# Patient Record
Sex: Female | Born: 1970
Health system: Southern US, Community
[De-identification: ages and names within clinical notes are randomized; demographics above are authoritative.]

## PROBLEM LIST (undated history)

## (undated) DIAGNOSIS — D696 Thrombocytopenia, unspecified: Secondary | ICD-10-CM

## (undated) DIAGNOSIS — B009 Herpesviral infection, unspecified: Secondary | ICD-10-CM

## (undated) DIAGNOSIS — O99119 Other diseases of the blood and blood-forming organs and certain disorders involving the immune mechanism complicating pregnancy, unspecified trimester: Secondary | ICD-10-CM

## (undated) DIAGNOSIS — C50412 Malignant neoplasm of upper-outer quadrant of left female breast: Secondary | ICD-10-CM

## (undated) DIAGNOSIS — Z923 Personal history of irradiation: Secondary | ICD-10-CM

## (undated) DIAGNOSIS — E063 Autoimmune thyroiditis: Secondary | ICD-10-CM

## (undated) DIAGNOSIS — R519 Headache, unspecified: Secondary | ICD-10-CM

## (undated) DIAGNOSIS — E039 Hypothyroidism, unspecified: Secondary | ICD-10-CM

## (undated) DIAGNOSIS — N83209 Unspecified ovarian cyst, unspecified side: Secondary | ICD-10-CM

## (undated) DIAGNOSIS — F329 Major depressive disorder, single episode, unspecified: Secondary | ICD-10-CM

## (undated) DIAGNOSIS — N200 Calculus of kidney: Secondary | ICD-10-CM

## (undated) DIAGNOSIS — R51 Headache: Secondary | ICD-10-CM

## (undated) DIAGNOSIS — G43909 Migraine, unspecified, not intractable, without status migrainosus: Secondary | ICD-10-CM

## (undated) DIAGNOSIS — F419 Anxiety disorder, unspecified: Secondary | ICD-10-CM

## (undated) DIAGNOSIS — N809 Endometriosis, unspecified: Secondary | ICD-10-CM

## (undated) DIAGNOSIS — C50919 Malignant neoplasm of unspecified site of unspecified female breast: Secondary | ICD-10-CM

## (undated) DIAGNOSIS — K219 Gastro-esophageal reflux disease without esophagitis: Secondary | ICD-10-CM

## (undated) DIAGNOSIS — F32A Depression, unspecified: Secondary | ICD-10-CM

## (undated) HISTORY — DX: Unspecified ovarian cyst, unspecified side: N83.209

## (undated) HISTORY — DX: Autoimmune thyroiditis: E06.3

## (undated) HISTORY — DX: Other diseases of the blood and blood-forming organs and certain disorders involving the immune mechanism complicating pregnancy, unspecified trimester: O99.119

## (undated) HISTORY — DX: Gastro-esophageal reflux disease without esophagitis: K21.9

## (undated) HISTORY — PX: LITHOTRIPSY: SUR834

## (undated) HISTORY — PX: BREAST LUMPECTOMY: SHX2

## (undated) HISTORY — DX: Malignant neoplasm of unspecified site of unspecified female breast: C50.919

## (undated) HISTORY — PX: DILATION AND CURETTAGE OF UTERUS: SHX78

## (undated) HISTORY — DX: Headache, unspecified: R51.9

## (undated) HISTORY — PX: WRIST SURGERY: SHX841

## (undated) HISTORY — DX: Headache: R51

## (undated) HISTORY — DX: Endometriosis, unspecified: N80.9

## (undated) HISTORY — DX: Migraine, unspecified, not intractable, without status migrainosus: G43.909

## (undated) HISTORY — PX: UPPER GASTROINTESTINAL ENDOSCOPY: SHX188

## (undated) HISTORY — PX: BREAST ENHANCEMENT SURGERY: SHX7

## (undated) HISTORY — DX: Thrombocytopenia, unspecified: D69.6

## (undated) HISTORY — DX: Hypothyroidism, unspecified: E03.9

## (undated) HISTORY — DX: Herpesviral infection, unspecified: B00.9

## (undated) HISTORY — PX: LAPAROSCOPY: SHX197

---

## 2009-01-16 LAB — US OB FOLLOW UP

## 2009-05-26 LAB — US OB COMP LESS 14 WKS

## 2013-11-26 LAB — HM PAP SMEAR: HM PAP: POSITIVE

## 2013-12-24 ENCOUNTER — Encounter: Payer: Self-pay | Admitting: Obstetrics & Gynecology

## 2013-12-26 ENCOUNTER — Other Ambulatory Visit: Payer: Self-pay | Admitting: Obstetrics & Gynecology

## 2013-12-26 ENCOUNTER — Ambulatory Visit (INDEPENDENT_AMBULATORY_CARE_PROVIDER_SITE_OTHER): Payer: 59 | Admitting: Obstetrics & Gynecology

## 2013-12-26 ENCOUNTER — Encounter: Payer: Self-pay | Admitting: *Deleted

## 2013-12-26 ENCOUNTER — Encounter: Payer: Self-pay | Admitting: Obstetrics & Gynecology

## 2013-12-26 VITALS — Ht 63.0 in | Wt 118.0 lb

## 2013-12-26 DIAGNOSIS — R87619 Unspecified abnormal cytological findings in specimens from cervix uteri: Secondary | ICD-10-CM

## 2013-12-26 MED ORDER — SILVER SULFADIAZINE 1 % EX CREA: TOPICAL_CREAM | CUTANEOUS | Status: DC

## 2013-12-30 ENCOUNTER — Telehealth: Payer: Self-pay | Admitting: Obstetrics & Gynecology

## 2013-12-30 NOTE — Telephone Encounter (Signed)
Pt informed Silvadene cream e-scribed on 12/26/2013 to [facility] with 11 refills.

## 2014-03-11 NOTE — Progress Notes (Signed)
Patient ID: [name], female   DOB: 06-16-1970, 44 y.o.   MRN: [id number] Addended note:  Colposcopy Procedure Note  Indications: Pap smear 1 months ago showed: ASCUS with POSITIVE high risk HPV. The prior pap showed no abnormalities.  Prior cervical/vaginal disease: normal exam without visible pathology. Prior cervical treatment: no treatment.  Procedure Details  The risks and benefits of the procedure and Written informed consent obtained.  Speculum placed in vagina and excellent visualization of cervix achieved, cervix swabbed x 3 with acetic acid solution.  Findings: Cervix: koilocytic atypia in small endocervical polyp noted, no mosaicism, no punctation and no abnormal vasculature; acetic acid used for cervical visualization. Squamous mucosa was normal otherwise Vaginal inspection: normal without visible lesions. Vulvar colposcopy: vulvar colposcopy not performed.  Specimens: Endocervical biopsy  Complications: none.  Plan: Per pt request will follow up her pathology with a Phone call to give results My impression is this is HPV atypia and nothing more  Path report: Present in Path tab but mistakenly not placed in this non visit notification note:  Benign changes no dysplasia or abnormalities Repeat Pap in 1 year for re evaluation

## 2014-03-21 ENCOUNTER — Other Ambulatory Visit (HOSPITAL_COMMUNITY): Payer: Self-pay | Admitting: Oncology

## 2014-03-21 DIAGNOSIS — R110 Nausea: Secondary | ICD-10-CM

## 2014-03-21 MED ORDER — ONDANSETRON HCL 4 MG PO TABS: 4.0000 mg | ORAL_TABLET | Freq: Four times a day (QID) | ORAL | Status: DC | PRN

## 2014-04-01 ENCOUNTER — Other Ambulatory Visit: Payer: Self-pay | Admitting: Urology

## 2014-04-01 ENCOUNTER — Other Ambulatory Visit (HOSPITAL_COMMUNITY): Payer: Self-pay | Admitting: Oncology

## 2014-04-01 ENCOUNTER — Ambulatory Visit (INDEPENDENT_AMBULATORY_CARE_PROVIDER_SITE_OTHER): Payer: 59 | Admitting: Urology

## 2014-04-01 ENCOUNTER — Ambulatory Visit (HOSPITAL_COMMUNITY)
Admission: RE | Admit: 2014-04-01 | Discharge: 2014-04-01 | Disposition: A | Discharge status: Home / Self Care | Payer: 59 | Source: Ambulatory Visit | Attending: Urology | Admitting: Urology

## 2014-04-01 DIAGNOSIS — N393 Stress incontinence (female) (male): Secondary | ICD-10-CM

## 2014-04-01 DIAGNOSIS — R32 Unspecified urinary incontinence: Secondary | ICD-10-CM | POA: Diagnosis not present

## 2014-04-01 DIAGNOSIS — R934 Abnormal findings on diagnostic imaging of urinary organs: Secondary | ICD-10-CM | POA: Insufficient documentation

## 2014-04-01 DIAGNOSIS — N200 Calculus of kidney: Secondary | ICD-10-CM | POA: Diagnosis not present

## 2014-04-01 DIAGNOSIS — Z0000 Encounter for general adult medical examination without abnormal findings: Secondary | ICD-10-CM

## 2014-04-01 DIAGNOSIS — Z87442 Personal history of urinary calculi: Secondary | ICD-10-CM | POA: Diagnosis not present

## 2014-04-02 ENCOUNTER — Other Ambulatory Visit (HOSPITAL_COMMUNITY): Payer: Self-pay | Admitting: Oncology

## 2014-04-02 DIAGNOSIS — G43511 Persistent migraine aura without cerebral infarction, intractable, with status migrainosus: Secondary | ICD-10-CM

## 2014-04-02 MED ORDER — FROVATRIPTAN SUCCINATE 2.5 MG PO TABS: 2.5000 mg | ORAL_TABLET | ORAL | Status: DC | PRN

## 2014-04-07 ENCOUNTER — Other Ambulatory Visit (HOSPITAL_COMMUNITY): Payer: Self-pay | Admitting: Oncology

## 2014-04-07 DIAGNOSIS — R232 Flushing: Secondary | ICD-10-CM

## 2014-04-08 ENCOUNTER — Other Ambulatory Visit (HOSPITAL_COMMUNITY): Payer: Self-pay | Admitting: Oncology

## 2014-04-08 DIAGNOSIS — D509 Iron deficiency anemia, unspecified: Secondary | ICD-10-CM

## 2014-04-08 MED ORDER — IRON POLYSACCH CMPLX-B12-FA 150-0.025-1 MG PO CAPS
1.0000 | ORAL_CAPSULE | Freq: Every day | ORAL | Status: DC
Start: 2014-04-08 — End: 2015-07-21

## 2014-04-09 ENCOUNTER — Other Ambulatory Visit (HOSPITAL_COMMUNITY): Payer: Self-pay | Admitting: Oncology

## 2014-04-09 ENCOUNTER — Other Ambulatory Visit: Payer: Self-pay | Admitting: Obstetrics & Gynecology

## 2014-04-09 DIAGNOSIS — Z0000 Encounter for general adult medical examination without abnormal findings: Secondary | ICD-10-CM

## 2014-04-09 DIAGNOSIS — R232 Flushing: Secondary | ICD-10-CM

## 2014-04-09 LAB — CBC WITH DIFFERENTIAL/PLATELET
BASOS PCT: 0 % (ref 0–1)
Basophils Absolute: 0 10*3/uL (ref 0.0–0.1)
Eosinophils Absolute: 0.1 10*3/uL (ref 0.0–0.7)
Eosinophils Relative: 3 % (ref 0–5)
HEMATOCRIT: 42.4 % (ref 36.0–46.0)
HEMOGLOBIN: 13.7 g/dL (ref 12.0–15.0)
Lymphocytes Relative: 32 % (ref 12–46)
Lymphs Abs: 1.2 10*3/uL (ref 0.7–4.0)
MCH: 31.1 pg (ref 26.0–34.0)
MCHC: 32.3 g/dL (ref 30.0–36.0)
MCV: 96.4 fL (ref 78.0–100.0)
MONOS PCT: 12 % (ref 3–12)
MPV: 10.9 fL (ref 8.6–12.4)
Monocytes Absolute: 0.4 10*3/uL (ref 0.1–1.0)
NEUTROS ABS: 1.9 10*3/uL (ref 1.7–7.7)
Neutrophils Relative %: 53 % (ref 43–77)
Platelets: 183 10*3/uL (ref 150–400)
RBC: 4.4 MIL/uL (ref 3.87–5.11)
RDW: 12.6 % (ref 11.5–15.5)
WBC: 3.6 10*3/uL — ABNORMAL LOW (ref 4.0–10.5)

## 2014-04-09 NOTE — Progress Notes (Signed)
Chart accessed to release lab orders - requisitions printed for [name].

## 2014-04-10 LAB — COMPREHENSIVE METABOLIC PANEL
ALK PHOS: 42 U/L (ref 39–117)
ALT: 11 U/L (ref 0–35)
AST: 18 U/L (ref 0–37)
Albumin: 4.4 g/dL (ref 3.5–5.2)
BUN: 16 mg/dL (ref 6–23)
CALCIUM: 8.9 mg/dL (ref 8.4–10.5)
CO2: 27 meq/L (ref 19–32)
Chloride: 104 mEq/L (ref 96–112)
Creat: 0.7 mg/dL (ref 0.50–1.10)
GLUCOSE: 84 mg/dL (ref 70–99)
POTASSIUM: 4.1 meq/L (ref 3.5–5.3)
SODIUM: 138 meq/L (ref 135–145)
Total Bilirubin: 0.5 mg/dL (ref 0.2–1.2)
Total Protein: 7.3 g/dL (ref 6.0–8.3)

## 2014-04-10 LAB — TSH+FREE T4
FREE T4: 1.47 ng/dL (ref 0.80–1.80)
TSH: 0.387 u[IU]/mL (ref 0.350–4.500)

## 2014-04-10 LAB — LIPID PANEL
CHOL/HDL RATIO: 2.9 ratio
CHOLESTEROL: 200 mg/dL (ref 0–200)
HDL: 70 mg/dL (ref >=46)
LDL Cholesterol: 120 mg/dL — ABNORMAL HIGH (ref 0–99)
TRIGLYCERIDES: 50 mg/dL (ref <150)
VLDL: 10 mg/dL (ref 0–40)

## 2014-04-10 LAB — FOLLICLE STIMULATING HORMONE: FSH: 12.4 m[IU]/mL

## 2014-04-10 LAB — FERRITIN: Ferritin: 31 ng/mL (ref 10–291)

## 2014-04-10 LAB — VITAMIN D 25 HYDROXY (VIT D DEFICIENCY, FRACTURES): Vit D, 25-Hydroxy: 20 ng/mL — ABNORMAL LOW (ref 30–100)

## 2014-04-10 LAB — ESTRADIOL: Estradiol: 53.6 pg/mL

## 2014-06-02 ENCOUNTER — Other Ambulatory Visit (HOSPITAL_COMMUNITY): Payer: Self-pay | Admitting: Oncology

## 2014-06-02 DIAGNOSIS — R0781 Pleurodynia: Secondary | ICD-10-CM

## 2014-06-04 ENCOUNTER — Other Ambulatory Visit (HOSPITAL_COMMUNITY): Payer: Self-pay | Admitting: Oncology

## 2014-06-04 DIAGNOSIS — G43909 Migraine, unspecified, not intractable, without status migrainosus: Secondary | ICD-10-CM | POA: Insufficient documentation

## 2014-06-04 DIAGNOSIS — G43101 Migraine with aura, not intractable, with status migrainosus: Secondary | ICD-10-CM

## 2014-06-04 MED ORDER — SUMATRIPTAN SUCCINATE 100 MG PO TABS: 100.0000 mg | ORAL_TABLET | ORAL | Status: DC | PRN

## 2014-07-24 ENCOUNTER — Telehealth (INDEPENDENT_AMBULATORY_CARE_PROVIDER_SITE_OTHER): Payer: Self-pay | Admitting: *Deleted

## 2014-07-24 NOTE — Telephone Encounter (Signed)
-----   Message from Shirley Houston, MD sent at 07/23/2014  3:47 PM EDT ----- Gershon Mussel, as discussed with you will schedule Dr. Whitney Muse for Colonoscopy. I believe we are dealing with hemorrhoidal bleeding since she does not have chronic symptoms.  Rehman ----- Message -----    From: Baird Cancer, PA-C    Sent: 07/22/2014   3:27 PM      To: Shirley Houston, MD  Good afternoon Dr. Laural Golden,  Hate to bother you, but since Dr. Whitney Muse and I share an office, and share a lot about ourselves, I would like to refer her to you.  Today she passed a significant amount of blood in her BM.  What options does she have and do you need a referral from me to get her in to see you?  Thank you!  Kirby Crigler, PA-C

## 2014-07-24 NOTE — Telephone Encounter (Signed)
Please go ahead per NUR and schedule her for a TCS.

## 2014-07-25 ENCOUNTER — Other Ambulatory Visit (INDEPENDENT_AMBULATORY_CARE_PROVIDER_SITE_OTHER): Payer: Self-pay | Admitting: *Deleted

## 2014-07-25 DIAGNOSIS — K625 Hemorrhage of anus and rectum: Secondary | ICD-10-CM

## 2014-07-25 NOTE — Telephone Encounter (Signed)
I spoke to Dr Whitney Muse (staff message) and she is sch'd for TCS on 08/11/14

## 2014-07-25 NOTE — Telephone Encounter (Signed)
Thanks

## 2014-07-31 ENCOUNTER — Telehealth (INDEPENDENT_AMBULATORY_CARE_PROVIDER_SITE_OTHER): Payer: Self-pay | Admitting: *Deleted

## 2014-07-31 MED ORDER — PEG-KCL-NACL-NASULF-NA ASC-C 100 G PO SOLR: 1.0000 | Freq: Once | ORAL | Status: DC

## 2014-07-31 NOTE — Telephone Encounter (Signed)
Patient needs movi prep 

## 2014-08-01 ENCOUNTER — Other Ambulatory Visit (HOSPITAL_COMMUNITY): Payer: Self-pay | Admitting: Oncology

## 2014-08-01 MED ORDER — DROSPIRENONE-ETHINYL ESTRADIOL 3-0.02 MG PO TABS: ORAL_TABLET | ORAL | Status: DC

## 2014-08-01 MED ORDER — LEVONORGEST-ETH ESTRAD 91-DAY 0.1-0.02 & 0.01 MG PO TABS: ORAL_TABLET | ORAL | Status: DC

## 2014-08-11 ENCOUNTER — Encounter (HOSPITAL_COMMUNITY): Payer: Self-pay | Admitting: *Deleted

## 2014-08-11 ENCOUNTER — Encounter (HOSPITAL_COMMUNITY)
Admission: RE | Disposition: A | Discharge status: Home / Self Care | Payer: Self-pay | Source: Ambulatory Visit | Attending: Internal Medicine

## 2014-08-11 ENCOUNTER — Ambulatory Visit (HOSPITAL_COMMUNITY)
Admission: RE | Admit: 2014-08-11 | Discharge: 2014-08-11 | Disposition: A | Discharge status: Home / Self Care | Payer: 59 | Source: Ambulatory Visit | Attending: Internal Medicine | Admitting: Internal Medicine

## 2014-08-11 DIAGNOSIS — K921 Melena: Secondary | ICD-10-CM | POA: Diagnosis present

## 2014-08-11 DIAGNOSIS — K5900 Constipation, unspecified: Secondary | ICD-10-CM | POA: Diagnosis not present

## 2014-08-11 DIAGNOSIS — K625 Hemorrhage of anus and rectum: Secondary | ICD-10-CM | POA: Diagnosis not present

## 2014-08-11 DIAGNOSIS — K644 Residual hemorrhoidal skin tags: Secondary | ICD-10-CM | POA: Insufficient documentation

## 2014-08-11 DIAGNOSIS — K648 Other hemorrhoids: Secondary | ICD-10-CM | POA: Insufficient documentation

## 2014-08-11 DIAGNOSIS — Z79899 Other long term (current) drug therapy: Secondary | ICD-10-CM | POA: Diagnosis not present

## 2014-08-11 DIAGNOSIS — E039 Hypothyroidism, unspecified: Secondary | ICD-10-CM | POA: Diagnosis not present

## 2014-08-11 DIAGNOSIS — Z800 Family history of malignant neoplasm of digestive organs: Secondary | ICD-10-CM | POA: Diagnosis not present

## 2014-08-11 HISTORY — DX: Calculus of kidney: N20.0

## 2014-08-11 HISTORY — PX: COLONOSCOPY: SHX5424

## 2014-08-11 SURGERY — COLONOSCOPY
Anesthesia: Moderate Sedation

## 2014-08-11 MED ORDER — MIDAZOLAM HCL 5 MG/5ML IJ SOLN
INTRAMUSCULAR | Status: DC | PRN
  Administered 2014-08-11 (×3): 2 mg via INTRAVENOUS
  Administered 2014-08-11: 3 mg via INTRAVENOUS
  Administered 2014-08-11 (×4): 2 mg via INTRAVENOUS

## 2014-08-11 MED ORDER — SODIUM CHLORIDE 0.9 % IV SOLN
INTRAVENOUS | Status: DC
  Administered 2014-08-11: 07:00:00 via INTRAVENOUS

## 2014-08-11 MED ORDER — STERILE WATER FOR IRRIGATION IR SOLN
Status: DC | PRN
  Administered 2014-08-11: 08:00:00

## 2014-08-11 MED ORDER — MEPERIDINE HCL 50 MG/ML IJ SOLN
INTRAMUSCULAR | Status: AC
  Filled 2014-08-11: qty 1

## 2014-08-11 MED ORDER — MEPERIDINE HCL 50 MG/ML IJ SOLN
INTRAMUSCULAR | Status: DC | PRN
  Administered 2014-08-11 (×2): 25 mg via INTRAVENOUS

## 2014-08-11 MED ORDER — MIDAZOLAM HCL 5 MG/5ML IJ SOLN
INTRAMUSCULAR | Status: AC
  Filled 2014-08-11: qty 10

## 2014-08-11 MED ORDER — MIDAZOLAM HCL 5 MG/5ML IJ SOLN
INTRAMUSCULAR | Status: AC
  Filled 2014-08-11: qty 5

## 2014-08-11 MED ORDER — POLYETHYLENE GLYCOL 3350 17 GM/SCOOP PO POWD: 8.5000 g | Freq: Every day | ORAL | Status: DC

## 2014-08-11 NOTE — Discharge Instructions (Signed)
Resume usual medications and high fiber diet. Polyethylene glycol 8.5 g by mouth daily or every other day. No driving for 24 hours.  Colonoscopy, Care After Refer to this sheet in the next few weeks. These instructions provide you with information on caring for yourself after your procedure. Your health care provider may also give you more specific instructions. Your treatment has been planned according to current medical practices, but problems sometimes occur. Call your health care provider if you have any problems or questions after your procedure. WHAT TO EXPECT AFTER THE PROCEDURE  After your procedure, it is typical to have the following:  A small amount of blood in your stool.  Moderate amounts of gas and mild abdominal cramping or bloating. HOME CARE INSTRUCTIONS  Do not drive, operate machinery, or sign important documents for 24 hours.  You may shower and resume your regular physical activities, but move at a slower pace for the first 24 hours.  Take frequent rest periods for the first 24 hours.  Walk around or put a warm pack on your abdomen to help reduce abdominal cramping and bloating.  Drink enough fluids to keep your urine clear or pale yellow.  You may resume your normal diet as instructed by your health care provider. Avoid heavy or fried foods that are hard to digest.  Avoid drinking alcohol for 24 hours or as instructed by your health care provider.  Only take over-the-counter or prescription medicines as directed by your health care provider.  If a tissue sample (biopsy) was taken during your procedure:  Do not take aspirin or blood thinners for 7 days, or as instructed by your health care provider.  Do not drink alcohol for 7 days, or as instructed by your health care provider.  Eat soft foods for the first 24 hours. SEEK MEDICAL CARE IF: You have persistent spotting of blood in your stool 2-3 days after the procedure. SEEK IMMEDIATE MEDICAL CARE  IF:  You have more than a small spotting of blood in your stool.  You pass large blood clots in your stool.  Your abdomen is swollen (distended).  You have nausea or vomiting.  You have a fever.  You have increasing abdominal pain that is not relieved with medicine.  Hemorrhoids Hemorrhoids are swollen veins around the rectum or anus. There are two types of hemorrhoids:   Internal hemorrhoids. These occur in the veins just inside the rectum. They may poke through to the outside and become irritated and painful.  External hemorrhoids. These occur in the veins outside the anus and can be felt as a painful swelling or hard lump near the anus. CAUSES  Pregnancy.   Obesity.   Constipation or diarrhea.   Straining to have a bowel movement.   Sitting for long periods on the toilet.  Heavy lifting or other activity that caused you to strain.  Anal intercourse. SYMPTOMS   Pain.   Anal itching or irritation.   Rectal bleeding.   Fecal leakage.   Anal swelling.   One or more lumps around the anus.  DIAGNOSIS  Your caregiver may be able to diagnose hemorrhoids by visual examination. Other examinations or tests that may be performed include:   Examination of the rectal area with a gloved hand (digital rectal exam).   Examination of anal canal using a small tube (scope).   A blood test if you have lost a significant amount of blood.  A test to look inside the colon (sigmoidoscopy or colonoscopy). TREATMENT  Most hemorrhoids can be treated at home. However, if symptoms do not seem to be getting better or if you have a lot of rectal bleeding, your caregiver may perform a procedure to help make the hemorrhoids get smaller or remove them completely. Possible treatments include:   Placing a rubber band at the base of the hemorrhoid to cut off the circulation (rubber band ligation).   Injecting a chemical to shrink the hemorrhoid (sclerotherapy).   Using a  tool to burn the hemorrhoid (infrared light therapy).   Surgically removing the hemorrhoid (hemorrhoidectomy).   Stapling the hemorrhoid to block blood flow to the tissue (hemorrhoid stapling).  HOME CARE INSTRUCTIONS   Eat foods with fiber, such as whole grains, beans, nuts, fruits, and vegetables. Ask your doctor about taking products with added fiber in them (fibersupplements).  Increase fluid intake. Drink enough water and fluids to keep your urine clear or pale yellow.   Exercise regularly.   Go to the bathroom when you have the urge to have a bowel movement. Do not wait.   Avoid straining to have bowel movements.   Keep the anal area dry and clean. Use wet toilet paper or moist towelettes after a bowel movement.   Medicated creams and suppositories may be used or applied as directed.   Only take over-the-counter or prescription medicines as directed by your caregiver.   Take warm sitz baths for 15-20 minutes, 3-4 times a day to ease pain and discomfort.   Place ice packs on the hemorrhoids if they are tender and swollen. Using ice packs between sitz baths may be helpful.   Put ice in a plastic bag.   Place a towel between your skin and the bag.   Leave the ice on for 15-20 minutes, 3-4 times a day.   Do not use a donut-shaped pillow or sit on the toilet for long periods. This increases blood pooling and pain.  SEEK MEDICAL CARE IF:  You have increasing pain and swelling that is not controlled by treatment or medicine.  You have uncontrolled bleeding.  You have difficulty or you are unable to have a bowel movement.  You have pain or inflammation outside the area of the hemorrhoids. MAKE SURE YOU:  Understand these instructions.  Will watch your condition.  Will get help right away if you are not doing well or get worse. High-Fiber Diet Fiber is found in fruits, vegetables, and grains. A high-fiber diet encourages the addition of more whole  grains, legumes, fruits, and vegetables in your diet. The recommended amount of fiber for adult males is 38 g per day. For adult females, it is 25 g per day. Pregnant and lactating women should get 28 g of fiber per day. If you have a digestive or bowel problem, ask your caregiver for advice before adding high-fiber foods to your diet. Eat a variety of high-fiber foods instead of only a select few type of foods.  PURPOSE  To increase stool bulk.  To make bowel movements more regular to prevent constipation.  To lower cholesterol.  To prevent overeating. WHEN IS THIS DIET USED?  It may be used if you have constipation and hemorrhoids.  It may be used if you have uncomplicated diverticulosis (intestine condition) and irritable bowel syndrome.  It may be used if you need help with weight management.  It may be used if you want to add it to your diet as a protective measure against atherosclerosis, diabetes, and cancer. SOURCES OF FIBER  Whole-grain breads and cereals.  Fruits, such as apples, oranges, bananas, berries, prunes, and pears.  Vegetables, such as green peas, carrots, sweet potatoes, beets, broccoli, cabbage, spinach, and artichokes.  Legumes, such split peas, soy, lentils.  Almonds. FIBER CONTENT IN FOODS Starches and Grains / Dietary Fiber (g)  Cheerios, 1 cup / 3 g  Corn Flakes cereal, 1 cup / 0.7 g  Rice crispy treat cereal, 1 cup / 0.3 g  Instant oatmeal (cooked),  cup / 2 g  Frosted wheat cereal, 1 cup / 5.1 g  Brown, long-grain rice (cooked), 1 cup / 3.5 g  White, long-grain rice (cooked), 1 cup / 0.6 g  Enriched macaroni (cooked), 1 cup / 2.5 g Legumes / Dietary Fiber (g)  Baked beans (canned, plain, or vegetarian),  cup / 5.2 g  Kidney beans (canned),  cup / 6.8 g  Pinto beans (cooked),  cup / 5.5 g Breads and Crackers / Dietary Fiber (g)  Plain or honey graham crackers, 2 squares / 0.7 g  Saltine crackers, 3 squares / 0.3 g  Plain,  salted pretzels, 10 pieces / 1.8 g  Whole-wheat bread, 1 slice / 1.9 g  White bread, 1 slice / 0.7 g  Raisin bread, 1 slice / 1.2 g  Plain bagel, 3 oz / 2 g  Flour tortilla, 1 oz / 0.9 g  Corn tortilla, 1 small / 1.5 g  Hamburger or hotdog bun, 1 small / 0.9 g Fruits / Dietary Fiber (g)  Apple with skin, 1 medium / 4.4 g  Sweetened applesauce,  cup / 1.5 g  Banana,  medium / 1.5 g  Grapes, 10 grapes / 0.4 g  Orange, 1 small / 2.3 g  Raisin, 1.5 oz / 1.6 g  Melon, 1 cup / 1.4 g Vegetables / Dietary Fiber (g)  Green beans (canned),  cup / 1.3 g  Carrots (cooked),  cup / 2.3 g  Broccoli (cooked),  cup / 2.8 g  Peas (cooked),  cup / 4.4 g  Mashed potatoes,  cup / 1.6 g  Lettuce, 1 cup / 0.5 g  Corn (canned),  cup / 1.6 g  Tomato,  cup / 1.1 g Document Released: 12/27/2004 Document Revised: 06/28/2011 Document Reviewed: 03/31/2011 ExitCare Patient Information 2015 [place], [place]. This information is not intended to replace advice given to you by your health care provider. Make sure you discuss any questions you have with your health care provider.

## 2014-08-11 NOTE — Op Note (Signed)
COLONOSCOPY PROCEDURE REPORT  PATIENT:  [name]  MR#:  [id number] Birthdate:  Oct 01, 1970, 44 y.o., female Endoscopist:  Dr. Rogene Houston, MD  Procedure Date: 08/11/2014  Procedure:   Colonoscopy  Indications:  Dr. Whitney Muse is 44 year old Caucasian female who was chronic hematochezia in the setting of constipation. She is undergoing diagnostic colonoscopy to make sure she doesn't have other lesions causing hematochezia. Family history significant for CRC in external grandmother, paternal uncle and a first cousin.  Informed Consent:  The procedure and risks were reviewed with the patient and informed consent was obtained.  Medications:  Demerol 50 mg IV Versed 17 mg IV  Description of procedure:  After a digital rectal exam was performed, that colonoscope was advanced from the anus through the rectum and colon to the area of the cecum, ileocecal valve and appendiceal orifice. The cecum was deeply intubated. These structures were well-seen and photographed for the record. From the level of the cecum and ileocecal valve, the scope was slowly and cautiously withdrawn. The mucosal surfaces were carefully surveyed utilizing scope tip to flexion to facilitate fold flattening as needed. The scope was pulled down into the rectum where a thorough exam including retroflexion was performed. Slim scope is used to complete the procedure.  Findings:   Prep excellent. Normal mucosa of cecum, ascending colon, hepatic flexure, transverse colon, splenic flexure, descending and sigmoid colon. Normal rectal mucosa. Hemorrhoids noted above and below the dentate line.   Therapeutic/Diagnostic Maneuvers Performed:  None  Complications:  None  Cecal Withdrawal Time:  8  minutes  Impression:  Examination performed to cecum. Small internal and external hemorrhoids otherwise normal colonoscopy.  Recommendations:  Standard instructions given. High fiber diet. Polyethylene glycol 8.5 g by mouth  daily. Will consider other treatment options if hematochezia frequent.  [name] U  08/11/2014 8:27 AM  CC: Dr. Delphina Cahill, MD & Dr. Rayne Du ref. provider found

## 2014-08-11 NOTE — H&P (Signed)
[name] is an 44 y.o. female.   Chief Complaint: Patient is here for colonoscopy. HPI: Dr. tenderness 44 year old Caucasian female who is here for diagnostic colonoscopy. She has history of constipation and has had intermittent hematochezia for several months. Lately she's had episodes where she is passing moderate to large amount of blood. She denies abdominal pain anorexia weight loss. Family history significant for CRC and paternal grandmother who was in her 48s, colon carcinoma in paternal uncle was in his 27s and one first cousin had malignant polyp removed.  Past Medical History  Diagnosis Date  . Gestational thrombocytopenia   . Ovarian cyst   . HSV (herpes simplex virus) infection   . Hypothyroidism   . Hashimoto's thyroiditis   . Endometriosis   . Headache   . Migraine   . Kidney stones     Past Surgical History  Procedure Laterality Date  . Breast surgery      implants  . Laparoscopy    . Wrist surgery    . Dilation and curettage of uterus    . Lithotripsy      Family History  Problem Relation Age of Onset  . Hypertension Mother   . Thyroid disease Mother   . Diabetes Father   . Thyroid disease Sister   . Heart disease Maternal Grandmother   . Heart disease Maternal Grandfather   . Cancer Paternal Grandmother     colon  . Colon cancer Paternal Grandmother   . Cancer Paternal Grandfather    Social History:  reports that she has never smoked. She has never used smokeless tobacco. She reports that she does not drink alcohol or use illicit drugs.  Allergies: No Known Allergies  Medications Prior to Admission  Medication Sig Dispense Refill  . desvenlafaxine (PRISTIQ) 50 MG 24 hr tablet Take 50 mg by mouth daily.    . drospirenone-ethinyl estradiol (YAZ,GIANVI,LORYNA) 3-0.02 MG tablet Take 1 tablet daily CONTINUOUSLY 1 Package 3  . frovatriptan (FROVA) 2.5 MG tablet Take 1 tablet (2.5 mg total) by mouth as needed for migraine. If recurs, may repeat after  2 hours. Max of 3 tabs in 24 hours. 12 tablet 0  . Iron Polysacch Cmplx-B12-FA 150-0.025-1 MG CAPS Take 1 tablet by mouth daily. 30 each 11  . levothyroxine (SYNTHROID, LEVOTHROID) 88 MCG tablet Take 88 mcg by mouth daily before breakfast.    . peg 3350 powder (MOVIPREP) 100 G SOLR Take 1 kit (200 g total) by mouth once. 1 kit 0  . calcium carbonate 200 MG capsule Take 250 mg by mouth daily.    . cholecalciferol (VITAMIN D) 1000 UNITS tablet Take 1,000 Units by mouth daily.    . clonazePAM (KLONOPIN) 0.5 MG tablet Take 0.5 mg by mouth 2 (two) times daily as needed for anxiety.    Marland Kitchen dexlansoprazole (DEXILANT) 60 MG capsule Take 60 mg by mouth daily.    Marland Kitchen eletriptan (RELPAX) 40 MG tablet Take 40 mg by mouth as needed for migraine or headache. One tablet by mouth at onset of headache. May repeat in 2 hours if headache persists or recurs.    . ondansetron (ZOFRAN) 4 MG tablet Take 1 tablet (4 mg total) by mouth every 6 (six) hours as needed for nausea or vomiting. 45 tablet 3  . silver sulfADIAZINE (SILVADENE) 1 % cream Use to area BID and as instructed 50 g 11  . SUMAtriptan (IMITREX) 100 MG tablet Take 1 tablet (100 mg total) by mouth every 2 (two) hours as  needed for migraine or headache. May repeat in 2 hours if headache persists or recurs. 100 tablet 0    No results found for this or any previous visit (from the past 48 hour(s)). No results found.  ROS  Blood pressure 101/55, pulse 63, temperature 97.9 F (36.6 C), temperature source Oral, resp. rate 18, height 5' 3"  (1.6 m), weight 115 lb (52.164 kg), last menstrual period 08/04/2014, SpO2 100 %. Physical Exam  Constitutional:  Well-developed thin Caucasian female in NAD.  HENT:  Mouth/Throat: Oropharynx is clear and moist.  Eyes: Conjunctivae are normal. No scleral icterus.  Neck: No thyromegaly present.  Cardiovascular: Normal rate, regular rhythm and normal heart sounds.   No murmur heard. GI: Soft. She exhibits no distension and  no mass. There is no tenderness.  Musculoskeletal: She exhibits no edema.  Lymphadenopathy:    She has no cervical adenopathy.  Neurological: She is alert.  Skin: Skin is warm and dry.     Assessment/Plan Hematochezia. Family history of CRC three second-degree relatives. Diagnostic colonoscopy.  REHMAN,NAJEEB U 08/11/2014, 7:33 AM

## 2014-08-12 ENCOUNTER — Encounter (HOSPITAL_COMMUNITY): Payer: Self-pay | Admitting: Internal Medicine

## 2014-09-17 ENCOUNTER — Other Ambulatory Visit (HOSPITAL_COMMUNITY): Payer: Self-pay | Admitting: Oncology

## 2014-09-17 DIAGNOSIS — K219 Gastro-esophageal reflux disease without esophagitis: Secondary | ICD-10-CM | POA: Insufficient documentation

## 2014-09-17 DIAGNOSIS — F32A Depression, unspecified: Secondary | ICD-10-CM

## 2014-09-17 DIAGNOSIS — G4700 Insomnia, unspecified: Secondary | ICD-10-CM

## 2014-09-17 DIAGNOSIS — E039 Hypothyroidism, unspecified: Secondary | ICD-10-CM | POA: Insufficient documentation

## 2014-09-17 DIAGNOSIS — F329 Major depressive disorder, single episode, unspecified: Secondary | ICD-10-CM

## 2014-09-17 MED ORDER — CLONAZEPAM 0.5 MG PO TABS: 0.5000 mg | ORAL_TABLET | Freq: Two times a day (BID) | ORAL | Status: DC | PRN

## 2014-09-17 MED ORDER — DESVENLAFAXINE SUCCINATE ER 50 MG PO TB24: 50.0000 mg | ORAL_TABLET | Freq: Every day | ORAL | Status: DC

## 2014-09-17 MED ORDER — SYNTHROID 88 MCG PO TABS: 88.0000 ug | ORAL_TABLET | Freq: Every day | ORAL | Status: DC

## 2014-09-17 MED ORDER — DEXLANSOPRAZOLE 60 MG PO CPDR: 60.0000 mg | DELAYED_RELEASE_CAPSULE | Freq: Every day | ORAL | Status: DC

## 2014-09-23 ENCOUNTER — Encounter (HOSPITAL_COMMUNITY): Payer: 59 | Attending: Oncology

## 2014-09-23 ENCOUNTER — Other Ambulatory Visit (HOSPITAL_COMMUNITY): Payer: Self-pay | Admitting: Oncology

## 2014-09-23 DIAGNOSIS — R3915 Urgency of urination: Secondary | ICD-10-CM | POA: Diagnosis not present

## 2014-09-23 DIAGNOSIS — R350 Frequency of micturition: Secondary | ICD-10-CM | POA: Diagnosis not present

## 2014-09-23 LAB — URINALYSIS, ROUTINE W REFLEX MICROSCOPIC
BILIRUBIN URINE: NEGATIVE
Glucose, UA: NEGATIVE mg/dL
Hgb urine dipstick: NEGATIVE
KETONES UR: NEGATIVE mg/dL
LEUKOCYTES UA: NEGATIVE
NITRITE: NEGATIVE
Protein, ur: NEGATIVE mg/dL
Specific Gravity, Urine: 1.025 (ref 1.005–1.030)
UROBILINOGEN UA: 0.2 mg/dL (ref 0.0–1.0)
pH: 6 (ref 5.0–8.0)

## 2014-09-29 ENCOUNTER — Telehealth: Payer: Self-pay | Admitting: Adult Health

## 2014-09-29 MED ORDER — ESTRADIOL-NORETHINDRONE ACET 0.05-0.14 MG/DAY TD PTTW: 1.0000 | MEDICATED_PATCH | TRANSDERMAL | Status: DC

## 2014-09-29 NOTE — Telephone Encounter (Signed)
Pt called having night sweats and more emotional with sleep disturbance and increase in headaches, is on pristiq, will try combi patch to see if helps.

## 2014-10-28 MED ORDER — SODIUM CHLORIDE 0.9 % IV SOLN
125.0000 mg | Freq: Once | INTRAVENOUS | Status: AC
  Filled 2014-10-28: qty 10

## 2014-11-03 ENCOUNTER — Other Ambulatory Visit (HOSPITAL_COMMUNITY): Payer: Self-pay | Admitting: Oncology

## 2014-11-03 DIAGNOSIS — E039 Hypothyroidism, unspecified: Secondary | ICD-10-CM

## 2014-11-14 ENCOUNTER — Other Ambulatory Visit (HOSPITAL_COMMUNITY): Payer: Self-pay | Admitting: Oncology

## 2014-11-14 DIAGNOSIS — F32A Depression, unspecified: Secondary | ICD-10-CM

## 2014-11-14 DIAGNOSIS — F329 Major depressive disorder, single episode, unspecified: Secondary | ICD-10-CM

## 2014-11-14 DIAGNOSIS — E039 Hypothyroidism, unspecified: Secondary | ICD-10-CM

## 2014-11-14 DIAGNOSIS — K219 Gastro-esophageal reflux disease without esophagitis: Secondary | ICD-10-CM

## 2014-11-14 DIAGNOSIS — G4700 Insomnia, unspecified: Secondary | ICD-10-CM

## 2014-11-14 MED ORDER — DEXLANSOPRAZOLE 60 MG PO CPDR: 60.0000 mg | DELAYED_RELEASE_CAPSULE | Freq: Every day | ORAL | Status: DC

## 2014-11-14 MED ORDER — SYNTHROID 88 MCG PO TABS: 88.0000 ug | ORAL_TABLET | Freq: Every day | ORAL | Status: DC

## 2014-11-14 MED ORDER — CLONAZEPAM 0.5 MG PO TABS: 0.5000 mg | ORAL_TABLET | Freq: Two times a day (BID) | ORAL | Status: DC | PRN

## 2014-11-14 MED ORDER — DESVENLAFAXINE SUCCINATE ER 50 MG PO TB24: 50.0000 mg | ORAL_TABLET | Freq: Every day | ORAL | Status: DC

## 2015-01-13 DIAGNOSIS — M542 Cervicalgia: Secondary | ICD-10-CM | POA: Diagnosis not present

## 2015-01-13 DIAGNOSIS — R51 Headache: Secondary | ICD-10-CM | POA: Diagnosis not present

## 2015-01-13 DIAGNOSIS — G43111 Migraine with aura, intractable, with status migrainosus: Secondary | ICD-10-CM | POA: Diagnosis not present

## 2015-01-13 DIAGNOSIS — G43719 Chronic migraine without aura, intractable, without status migrainosus: Secondary | ICD-10-CM | POA: Diagnosis not present

## 2015-01-13 DIAGNOSIS — G43839 Menstrual migraine, intractable, without status migrainosus: Secondary | ICD-10-CM | POA: Diagnosis not present

## 2015-01-13 DIAGNOSIS — M791 Myalgia: Secondary | ICD-10-CM | POA: Diagnosis not present

## 2015-01-19 MED FILL — PRISTIQ ER 50 MG TABLET: 50 | 30 days supply | Qty: 30 | Fill #2

## 2015-01-19 MED FILL — DEXILANT DR 60 MG CAPSULE: 60 | 30 days supply | Qty: 30 | Fill #2

## 2015-01-19 MED FILL — clonazePAM 0.5 MG TABS: 0.5 | 30 days supply | Qty: 60 | Fill #1

## 2015-01-19 MED FILL — SYNTHROID 88 MCG TABLET: 88 | 30 days supply | Qty: 30 | Fill #2

## 2015-01-27 DIAGNOSIS — M791 Myalgia: Secondary | ICD-10-CM | POA: Diagnosis not present

## 2015-01-27 DIAGNOSIS — G43839 Menstrual migraine, intractable, without status migrainosus: Secondary | ICD-10-CM | POA: Diagnosis not present

## 2015-01-27 DIAGNOSIS — M542 Cervicalgia: Secondary | ICD-10-CM | POA: Diagnosis not present

## 2015-01-27 DIAGNOSIS — R51 Headache: Secondary | ICD-10-CM | POA: Diagnosis not present

## 2015-01-27 DIAGNOSIS — G43719 Chronic migraine without aura, intractable, without status migrainosus: Secondary | ICD-10-CM | POA: Diagnosis not present

## 2015-01-27 DIAGNOSIS — G43111 Migraine with aura, intractable, with status migrainosus: Secondary | ICD-10-CM | POA: Diagnosis not present

## 2015-01-27 MED FILL — ZONISAMIDE 50 MG CAPSULE: 50 | 30 days supply | Qty: 60 | Fill #0

## 2015-02-10 DIAGNOSIS — G43839 Menstrual migraine, intractable, without status migrainosus: Secondary | ICD-10-CM | POA: Diagnosis not present

## 2015-02-10 DIAGNOSIS — M542 Cervicalgia: Secondary | ICD-10-CM | POA: Diagnosis not present

## 2015-02-10 DIAGNOSIS — G43719 Chronic migraine without aura, intractable, without status migrainosus: Secondary | ICD-10-CM | POA: Diagnosis not present

## 2015-02-10 DIAGNOSIS — M791 Myalgia: Secondary | ICD-10-CM | POA: Diagnosis not present

## 2015-02-10 DIAGNOSIS — R51 Headache: Secondary | ICD-10-CM | POA: Diagnosis not present

## 2015-02-10 DIAGNOSIS — G43111 Migraine with aura, intractable, with status migrainosus: Secondary | ICD-10-CM | POA: Diagnosis not present

## 2015-02-18 MED FILL — DEXILANT DR 60 MG CAPSULE: 60 | 30 days supply | Qty: 30 | Fill #3

## 2015-02-18 MED FILL — SYNTHROID 88 MCG TABLET: 88 | 30 days supply | Qty: 30 | Fill #3

## 2015-02-18 MED FILL — PRISTIQ ER 50 MG TABLET: 50 | 30 days supply | Qty: 30 | Fill #3

## 2015-02-23 ENCOUNTER — Ambulatory Visit (HOSPITAL_COMMUNITY)
Admission: RE | Admit: 2015-02-23 | Discharge: 2015-02-23 | Disposition: A | Discharge status: Home / Self Care | Payer: 59 | Source: Ambulatory Visit | Attending: Oncology | Admitting: Oncology

## 2015-02-23 ENCOUNTER — Other Ambulatory Visit (HOSPITAL_COMMUNITY): Payer: Self-pay | Admitting: Oncology

## 2015-02-23 DIAGNOSIS — R1012 Left upper quadrant pain: Secondary | ICD-10-CM | POA: Diagnosis not present

## 2015-02-24 DIAGNOSIS — G43719 Chronic migraine without aura, intractable, without status migrainosus: Secondary | ICD-10-CM | POA: Diagnosis not present

## 2015-02-24 DIAGNOSIS — R51 Headache: Secondary | ICD-10-CM | POA: Diagnosis not present

## 2015-02-24 DIAGNOSIS — G43111 Migraine with aura, intractable, with status migrainosus: Secondary | ICD-10-CM | POA: Diagnosis not present

## 2015-02-24 DIAGNOSIS — M542 Cervicalgia: Secondary | ICD-10-CM | POA: Diagnosis not present

## 2015-02-24 DIAGNOSIS — G43839 Menstrual migraine, intractable, without status migrainosus: Secondary | ICD-10-CM | POA: Diagnosis not present

## 2015-02-24 DIAGNOSIS — M791 Myalgia: Secondary | ICD-10-CM | POA: Diagnosis not present

## 2015-03-02 MED FILL — ZONISAMIDE 50 MG CAPSULE: 50 | 30 days supply | Qty: 60 | Fill #1

## 2015-03-10 DIAGNOSIS — R51 Headache: Secondary | ICD-10-CM | POA: Diagnosis not present

## 2015-03-10 DIAGNOSIS — G43111 Migraine with aura, intractable, with status migrainosus: Secondary | ICD-10-CM | POA: Diagnosis not present

## 2015-03-10 DIAGNOSIS — M791 Myalgia: Secondary | ICD-10-CM | POA: Diagnosis not present

## 2015-03-10 DIAGNOSIS — G43839 Menstrual migraine, intractable, without status migrainosus: Secondary | ICD-10-CM | POA: Diagnosis not present

## 2015-03-10 DIAGNOSIS — M542 Cervicalgia: Secondary | ICD-10-CM | POA: Diagnosis not present

## 2015-03-10 DIAGNOSIS — G43719 Chronic migraine without aura, intractable, without status migrainosus: Secondary | ICD-10-CM | POA: Diagnosis not present

## 2015-03-10 MED FILL — CYCLOBENZAPRINE 5 MG TABLET: 5 | 60 days supply | Qty: 30 | Fill #0

## 2015-03-13 ENCOUNTER — Other Ambulatory Visit (HOSPITAL_COMMUNITY): Payer: Self-pay | Admitting: Oncology

## 2015-03-13 DIAGNOSIS — F32A Depression, unspecified: Secondary | ICD-10-CM

## 2015-03-13 DIAGNOSIS — K219 Gastro-esophageal reflux disease without esophagitis: Secondary | ICD-10-CM

## 2015-03-13 DIAGNOSIS — G4700 Insomnia, unspecified: Secondary | ICD-10-CM

## 2015-03-13 DIAGNOSIS — E039 Hypothyroidism, unspecified: Secondary | ICD-10-CM

## 2015-03-13 DIAGNOSIS — F329 Major depressive disorder, single episode, unspecified: Secondary | ICD-10-CM

## 2015-03-13 MED ORDER — SYNTHROID 88 MCG PO TABS: 88.0000 ug | ORAL_TABLET | Freq: Every day | ORAL | Status: DC

## 2015-03-13 MED ORDER — CLONAZEPAM 0.5 MG PO TABS: 0.5000 mg | ORAL_TABLET | Freq: Two times a day (BID) | ORAL | Status: DC | PRN

## 2015-03-13 MED ORDER — DEXLANSOPRAZOLE 60 MG PO CPDR: 60.0000 mg | DELAYED_RELEASE_CAPSULE | Freq: Every day | ORAL | Status: DC

## 2015-03-13 MED ORDER — DESVENLAFAXINE SUCCINATE ER 50 MG PO TB24: 50.0000 mg | ORAL_TABLET | Freq: Every day | ORAL | Status: DC

## 2015-03-13 MED FILL — DEXILANT DR 60 MG CAPSULE: 60 | 30 days supply | Qty: 30 | Fill #0

## 2015-03-13 MED FILL — SYNTHROID 88 MCG TABLET: 88 | 30 days supply | Qty: 30 | Fill #0

## 2015-03-16 MED FILL — clonazePAM 0.5 MG TABS: 0.5 | 30 days supply | Qty: 60 | Fill #0

## 2015-03-24 DIAGNOSIS — Z6820 Body mass index (BMI) 20.0-20.9, adult: Secondary | ICD-10-CM | POA: Diagnosis not present

## 2015-03-24 DIAGNOSIS — Z01419 Encounter for gynecological examination (general) (routine) without abnormal findings: Secondary | ICD-10-CM | POA: Diagnosis not present

## 2015-04-07 ENCOUNTER — Encounter (HOSPITAL_COMMUNITY): Payer: 59 | Attending: Oncology

## 2015-04-07 DIAGNOSIS — D509 Iron deficiency anemia, unspecified: Secondary | ICD-10-CM

## 2015-04-07 MED ORDER — SODIUM CHLORIDE 0.9 % IV SOLN
Freq: Once | INTRAVENOUS | Status: AC
  Administered 2015-04-07: 16:00:00 via INTRAVENOUS
  Filled 2015-04-07: qty 15

## 2015-04-07 NOTE — Progress Notes (Signed)
Pt tolerated iron infusion well today

## 2015-04-15 MED FILL — SYNTHROID 88 MCG TABLET: 88 | 30 days supply | Qty: 30 | Fill #1

## 2015-04-15 MED FILL — DEXILANT DR 60 MG CAPSULE: 60 | 30 days supply | Qty: 30 | Fill #1

## 2015-04-16 DIAGNOSIS — G43111 Migraine with aura, intractable, with status migrainosus: Secondary | ICD-10-CM | POA: Diagnosis not present

## 2015-04-16 DIAGNOSIS — G43719 Chronic migraine without aura, intractable, without status migrainosus: Secondary | ICD-10-CM | POA: Diagnosis not present

## 2015-04-16 DIAGNOSIS — M542 Cervicalgia: Secondary | ICD-10-CM | POA: Diagnosis not present

## 2015-04-16 DIAGNOSIS — R51 Headache: Secondary | ICD-10-CM | POA: Diagnosis not present

## 2015-04-16 DIAGNOSIS — G43839 Menstrual migraine, intractable, without status migrainosus: Secondary | ICD-10-CM | POA: Diagnosis not present

## 2015-04-16 DIAGNOSIS — M791 Myalgia: Secondary | ICD-10-CM | POA: Diagnosis not present

## 2015-04-16 MED FILL — ZONISAMIDE 50 MG CAPSULE: 50 | 30 days supply | Qty: 60 | Fill #0

## 2015-04-29 ENCOUNTER — Other Ambulatory Visit (HOSPITAL_COMMUNITY): Payer: Self-pay | Admitting: Oncology

## 2015-04-29 DIAGNOSIS — G43101 Migraine with aura, not intractable, with status migrainosus: Secondary | ICD-10-CM

## 2015-04-29 MED ORDER — DIVALPROEX SODIUM 250 MG PO DR TAB: 250.0000 mg | DELAYED_RELEASE_TABLET | Freq: Two times a day (BID) | ORAL | Status: DC

## 2015-04-29 MED ORDER — ZONISAMIDE 25 MG PO CAPS: 25.0000 mg | ORAL_CAPSULE | Freq: Every day | ORAL | Status: DC

## 2015-04-29 MED FILL — DIVALPROEX SOD DR 250 MG TA: 250 | 30 days supply | Qty: 60 | Fill #0

## 2015-05-12 ENCOUNTER — Ambulatory Visit: Payer: 59 | Admitting: Urology

## 2015-05-18 ENCOUNTER — Other Ambulatory Visit (HOSPITAL_COMMUNITY): Payer: Self-pay | Admitting: Oncology

## 2015-05-18 DIAGNOSIS — F329 Major depressive disorder, single episode, unspecified: Secondary | ICD-10-CM

## 2015-05-18 DIAGNOSIS — F32A Depression, unspecified: Secondary | ICD-10-CM

## 2015-05-18 MED ORDER — DESVENLAFAXINE SUCCINATE ER 50 MG PO TB24: 50.0000 mg | ORAL_TABLET | Freq: Every day | ORAL | Status: DC

## 2015-05-18 MED FILL — SYNTHROID 88 MCG TABLET: 88 | 30 days supply | Qty: 30 | Fill #2

## 2015-05-18 MED FILL — DESVENLAFAXINE ER 50 MG TAB: 50 | 30 days supply | Qty: 30 | Fill #0

## 2015-05-18 MED FILL — DEXILANT DR 60 MG CAPSULE: 60 | 30 days supply | Qty: 30 | Fill #2

## 2015-05-18 MED FILL — ZONISAMIDE 50 MG CAPSULE: 50 | 30 days supply | Qty: 60 | Fill #1

## 2015-05-25 ENCOUNTER — Other Ambulatory Visit (HOSPITAL_COMMUNITY): Payer: Self-pay | Admitting: Oncology

## 2015-05-25 ENCOUNTER — Encounter (HOSPITAL_COMMUNITY): Payer: 59 | Attending: Oncology

## 2015-05-25 DIAGNOSIS — E039 Hypothyroidism, unspecified: Secondary | ICD-10-CM | POA: Insufficient documentation

## 2015-05-25 DIAGNOSIS — D509 Iron deficiency anemia, unspecified: Secondary | ICD-10-CM

## 2015-05-25 LAB — T4, FREE: Free T4: 0.98 ng/dL (ref 0.61–1.12)

## 2015-05-25 LAB — TSH: TSH: 0.575 u[IU]/mL (ref 0.350–4.500)

## 2015-05-25 NOTE — Progress Notes (Signed)
[name]'s reason for visit today is for labs as scheduled per MD orders.  Venipuncture performed with a 23 gauge butterfly needle to L Antecubital.  [name] tolerated procedure well and without incident; questions were answered and patient was discharged.

## 2015-05-25 NOTE — Addendum Note (Signed)
Addended by: Elenor Legato on: 05/25/2015 03:32 PM   Modules accepted: Orders

## 2015-06-18 ENCOUNTER — Other Ambulatory Visit (HOSPITAL_COMMUNITY): Payer: Self-pay | Admitting: Oncology

## 2015-06-18 DIAGNOSIS — G43101 Migraine with aura, not intractable, with status migrainosus: Secondary | ICD-10-CM

## 2015-06-18 MED ORDER — ZONISAMIDE 50 MG PO CAPS: 50.0000 mg | ORAL_CAPSULE | Freq: Two times a day (BID) | ORAL | Status: DC

## 2015-06-18 MED FILL — ZONISAMIDE 50 MG CAPSULE: 50 | 30 days supply | Qty: 60 | Fill #0

## 2015-06-18 MED FILL — DEXILANT DR 60 MG CAPSULE: 60 | 30 days supply | Qty: 30 | Fill #3

## 2015-06-18 MED FILL — SYNTHROID 88 MCG TABLET: 88 | 30 days supply | Qty: 30 | Fill #3

## 2015-06-18 MED FILL — clonazePAM 0.5 MG TABS: 0.5 | 30 days supply | Qty: 60 | Fill #1

## 2015-06-18 MED FILL — DESVENLAFAXINE ER 50 MG TAB: 50 | 30 days supply | Qty: 30 | Fill #1

## 2015-06-22 ENCOUNTER — Encounter: Payer: Self-pay | Admitting: Internal Medicine

## 2015-06-22 ENCOUNTER — Telehealth: Payer: Self-pay | Admitting: Internal Medicine

## 2015-06-22 MED ORDER — METOCLOPRAMIDE HCL 10 MG PO TABS: ORAL_TABLET | ORAL | Status: DC

## 2015-06-22 NOTE — Telephone Encounter (Signed)
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  To that she been on multiple PPIs without relief. Over the past month or so she's had increasing problems with heartburn and actually reflux at night and is now having a cough as well. No clearcut dysphagia though vague perhaps.  Her mom has Barrett's and her aunt on the maternal side died of esophageal cancer. She is concerned about this though 6 years ago in [place] she had an endoscopy that showed esophagitis no Barrett's reported.  No other changes at this time although she changed some medication back in the winter around Christmas time or December. That she was doing fine then.  I have recommended that she stay on her DEXA lot but we will add some over-the-counter Zegerid at bedtime and also Gaviscon. Also metoclopramide 10 mg daily at bedtime or twice a day when necessary. Side effects reviewed. She probably does need an upper endoscopy at some point though I would try to get her symptoms controlled first.  We will contact her to arrange an appointment to see me in July.

## 2015-06-23 MED FILL — METOCLOPRAMIDE 10 MG TABLET: 10 | 15 days supply | Qty: 60 | Fill #0

## 2015-06-23 NOTE — Telephone Encounter (Signed)
Left message for patient to call back  

## 2015-06-24 DIAGNOSIS — G43839 Menstrual migraine, intractable, without status migrainosus: Secondary | ICD-10-CM | POA: Diagnosis not present

## 2015-06-24 DIAGNOSIS — M542 Cervicalgia: Secondary | ICD-10-CM | POA: Diagnosis not present

## 2015-06-24 DIAGNOSIS — R51 Headache: Secondary | ICD-10-CM | POA: Diagnosis not present

## 2015-06-24 DIAGNOSIS — G43111 Migraine with aura, intractable, with status migrainosus: Secondary | ICD-10-CM | POA: Diagnosis not present

## 2015-06-24 DIAGNOSIS — M791 Myalgia: Secondary | ICD-10-CM | POA: Diagnosis not present

## 2015-06-24 DIAGNOSIS — G43719 Chronic migraine without aura, intractable, without status migrainosus: Secondary | ICD-10-CM | POA: Diagnosis not present

## 2015-06-24 MED FILL — ZONISAMIDE 25 MG CAPSULE: 25 | 30 days supply | Qty: 30 | Fill #0

## 2015-06-30 NOTE — Telephone Encounter (Signed)
Pt scheduled to see Dr. Carlean Purl 08/03/15@3 :15pm. Appt letter mailed to pt.

## 2015-07-08 ENCOUNTER — Other Ambulatory Visit (HOSPITAL_COMMUNITY): Payer: Self-pay | Admitting: Oncology

## 2015-07-08 ENCOUNTER — Ambulatory Visit (HOSPITAL_COMMUNITY)
Admission: RE | Admit: 2015-07-08 | Discharge: 2015-07-08 | Disposition: A | Discharge status: Home / Self Care | Payer: 59 | Source: Ambulatory Visit | Attending: Oncology | Admitting: Oncology

## 2015-07-08 DIAGNOSIS — N6320 Unspecified lump in the left breast, unspecified quadrant: Secondary | ICD-10-CM

## 2015-07-08 DIAGNOSIS — R921 Mammographic calcification found on diagnostic imaging of breast: Secondary | ICD-10-CM | POA: Insufficient documentation

## 2015-07-08 DIAGNOSIS — N63 Unspecified lump in unspecified breast: Secondary | ICD-10-CM

## 2015-07-08 DIAGNOSIS — D0512 Intraductal carcinoma in situ of left breast: Secondary | ICD-10-CM | POA: Insufficient documentation

## 2015-07-08 DIAGNOSIS — C50412 Malignant neoplasm of upper-outer quadrant of left female breast: Secondary | ICD-10-CM | POA: Diagnosis not present

## 2015-07-08 MED ORDER — LIDOCAINE HCL (PF) 1 % IJ SOLN
INTRAMUSCULAR | Status: DC
Start: 2015-07-08 — End: 2015-07-09
  Filled 2015-07-08: qty 10

## 2015-07-10 ENCOUNTER — Other Ambulatory Visit: Payer: Self-pay | Admitting: General Surgery

## 2015-07-10 DIAGNOSIS — C50412 Malignant neoplasm of upper-outer quadrant of left female breast: Secondary | ICD-10-CM | POA: Insufficient documentation

## 2015-07-10 DIAGNOSIS — C50512 Malignant neoplasm of lower-outer quadrant of left female breast: Secondary | ICD-10-CM

## 2015-07-13 ENCOUNTER — Ambulatory Visit (HOSPITAL_COMMUNITY)
Admission: RE | Admit: 2015-07-13 | Discharge: 2015-07-13 | Disposition: A | Discharge status: Home / Self Care | Payer: 59 | Source: Ambulatory Visit | Attending: Oncology | Admitting: Oncology

## 2015-07-13 ENCOUNTER — Other Ambulatory Visit (HOSPITAL_COMMUNITY): Payer: Self-pay | Admitting: Oncology

## 2015-07-13 DIAGNOSIS — G43101 Migraine with aura, not intractable, with status migrainosus: Secondary | ICD-10-CM | POA: Diagnosis not present

## 2015-07-13 DIAGNOSIS — C50512 Malignant neoplasm of lower-outer quadrant of left female breast: Secondary | ICD-10-CM

## 2015-07-13 DIAGNOSIS — R51 Headache: Secondary | ICD-10-CM | POA: Diagnosis not present

## 2015-07-13 MED ORDER — GADOBENATE DIMEGLUMINE 529 MG/ML IV SOLN
10.0000 mL | Freq: Once | INTRAVENOUS | Status: AC | PRN
  Administered 2015-07-13: 10 mL via INTRAVENOUS

## 2015-07-13 MED FILL — ZONISAMIDE 50 MG CAPSULE: 50 | 30 days supply | Qty: 60 | Fill #0

## 2015-07-16 DIAGNOSIS — C50412 Malignant neoplasm of upper-outer quadrant of left female breast: Secondary | ICD-10-CM | POA: Diagnosis not present

## 2015-07-16 DIAGNOSIS — Z9882 Breast implant status: Secondary | ICD-10-CM | POA: Diagnosis not present

## 2015-07-17 ENCOUNTER — Encounter: Payer: Self-pay | Admitting: Hematology and Oncology

## 2015-07-17 MED FILL — DESVENLAFAXINE ER 50 MG TAB: 50 | 30 days supply | Qty: 30 | Fill #2

## 2015-07-17 MED FILL — clonazePAM 0.5 MG TABS: 0.5 | 30 days supply | Qty: 60 | Fill #2

## 2015-07-21 ENCOUNTER — Other Ambulatory Visit (HOSPITAL_COMMUNITY): Payer: Self-pay | Admitting: Oncology

## 2015-07-22 ENCOUNTER — Encounter: Payer: Self-pay | Admitting: Hematology and Oncology

## 2015-07-22 ENCOUNTER — Ambulatory Visit (HOSPITAL_BASED_OUTPATIENT_CLINIC_OR_DEPARTMENT_OTHER): Payer: 59 | Admitting: Hematology and Oncology

## 2015-07-22 ENCOUNTER — Ambulatory Visit
Admission: RE | Admit: 2015-07-22 | Discharge: 2015-07-22 | Disposition: A | Discharge status: Home / Self Care | Payer: 59 | Source: Ambulatory Visit | Attending: General Surgery | Admitting: General Surgery

## 2015-07-22 VITALS — BP 116/62 | HR 70 | Temp 99.0°F | Resp 18 | Ht 63.0 in | Wt 116.8 lb

## 2015-07-22 DIAGNOSIS — C50412 Malignant neoplasm of upper-outer quadrant of left female breast: Secondary | ICD-10-CM | POA: Diagnosis not present

## 2015-07-22 DIAGNOSIS — Z808 Family history of malignant neoplasm of other organs or systems: Secondary | ICD-10-CM

## 2015-07-22 DIAGNOSIS — Z803 Family history of malignant neoplasm of breast: Secondary | ICD-10-CM

## 2015-07-22 DIAGNOSIS — Z8052 Family history of malignant neoplasm of bladder: Secondary | ICD-10-CM

## 2015-07-22 DIAGNOSIS — Z170 Estrogen receptor positive status [ER+]: Secondary | ICD-10-CM

## 2015-07-22 DIAGNOSIS — G43101 Migraine with aura, not intractable, with status migrainosus: Secondary | ICD-10-CM

## 2015-07-22 DIAGNOSIS — C50512 Malignant neoplasm of lower-outer quadrant of left female breast: Secondary | ICD-10-CM

## 2015-07-22 DIAGNOSIS — Z800 Family history of malignant neoplasm of digestive organs: Secondary | ICD-10-CM

## 2015-07-22 MED ORDER — GADOBENATE DIMEGLUMINE 529 MG/ML IV SOLN
10.0000 mL | Freq: Once | INTRAVENOUS | Status: AC | PRN
  Administered 2015-07-22: 10 mL via INTRAVENOUS

## 2015-07-22 MED ORDER — TAMOXIFEN CITRATE 20 MG PO TABS: 20.0000 mg | ORAL_TABLET | Freq: Every day | ORAL | Status: DC

## 2015-07-22 MED ORDER — ZONISAMIDE 50 MG PO CAPS: 200.0000 mg | ORAL_CAPSULE | Freq: Every day | ORAL | Status: AC

## 2015-07-22 MED FILL — TAMOXIFEN 20 MG TABLET: 20 | 30 days supply | Qty: 30 | Fill #0

## 2015-07-22 NOTE — Progress Notes (Signed)
[facility] CONSULT NOTE  Patient Care Team: Celene Squibb, MD as PCP - General (Internal Medicine)  CHIEF COMPLAINTS/PURPOSE OF CONSULTATION:  Newly diagnosed breast cancer  HISTORY OF PRESENTING ILLNESS:  [name] 45 y.o. female is here because of recent diagnosis of left breast cancer. Gretta felt an abnormality in the left breast upper outer quadrant accompanied by itching of the breast. She underwent an mammogram and ultrasound which for the revealed a 4 mm area suspicious masses along with a lymph node that has been stable in the breast. 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 She underwent a breast MRI today and is here accompanied by her husband to discuss the pathology reports and radiology reports.  I reviewed her records extensively and collaborated the history with the patient.  SUMMARY OF ONCOLOGIC HISTORY:   Breast cancer of upper-outer quadrant of left female breast ([place])   07/08/2015 Initial Diagnosis Left breast biopsy 2:00: Invasive ductal carcinoma with DCIS, LCIS, grade 1, ER 95%, PR 95%, HER-2 negative ratio 1.26, Ki-67 10%,   07/08/2015 Mammogram Left breast upper outer quadrant: 3 mm group of amorphous calcifications in close proximity to circumscribed oval mass, lymph node; ultrasound 2:00 position 4 mm irregular hypoechoic mass    07/22/2015 Breast MRI 17 x 6 x 6 mm area of enhancement in the left breast 2:00 position at the site of the previous biopsy. These are 2 masses separated by a clip. No other suspicious findings in the remainder of the breast or lymph nodes.    In terms of breast cancer risk profile:  She menarched at early age of 25 and is perimenopausal  She had 2 pregnancies, her first child was born at age 89  She has received birth control pills for approximately 1-2 years.  She was never exposed to fertility  medications or hormone replacement therapy.  She has  family history of Breast/GYN/GI cancer She has a paternal great aunt breast cancer age 17 Paternal grandmother age 33 colon cancer Paternal grandfather age 43 for bladder cancer Maternal great aunt 76 esophageal cancer Maternal uncle age 24 with esophageal cancer Sister age 75 with thyroid cancer  MEDICAL HISTORY:  Past Medical History  Diagnosis Date  . Gestational thrombocytopenia ([place])   . Ovarian cyst   . HSV (herpes simplex virus) infection   . Hypothyroidism   . Hashimoto's thyroiditis   . Endometriosis   . Headache   . Migraine   . Kidney stones   . GERD (gastroesophageal reflux disease)     SURGICAL HISTORY: Past Surgical History  Procedure Laterality Date  . Breast surgery      implants  . Laparoscopy    . Wrist surgery    . Dilation and curettage of uterus    . Lithotripsy    . Colonoscopy N/A 08/11/2014    Procedure: COLONOSCOPY;  Surgeon: Rogene Houston, MD;  Location: [facility];  Service: Endoscopy;  Laterality: N/A;  730  . Upper gastrointestinal endoscopy      SOCIAL HISTORY: Social History   Social History  . Marital Status: Married    Spouse Name: N/A  . Number of Children: N/A  . Years of Education: N/A   Occupational History  . Not on file.   Social History Main Topics  . Smoking status: Never Smoker   . Smokeless tobacco: Never Used  .  Alcohol Use: No  . Drug Use: No  . Sexual Activity: Yes   Other Topics Concern  . Not on file   Social History Narrative    FAMILY HISTORY: Family History  Problem Relation Age of Onset  . Hypertension Mother   . Thyroid disease Mother   . Barrett's esophagus Mother   . Diabetes Father   . Thyroid disease Sister   . Heart disease Maternal Grandmother   . Heart disease Maternal Grandfather   . Colon cancer Paternal Grandmother   . Colon cancer Paternal Grandmother   . Cancer Paternal Grandfather   . Esophageal cancer Maternal Aunt      ALLERGIES:  has No Known Allergies.  MEDICATIONS:  Current Outpatient Prescriptions  Medication Sig Dispense Refill  . calcium carbonate 200 MG capsule Take 250 mg by mouth daily.    . cholecalciferol (VITAMIN D) 1000 UNITS tablet Take 1,000 Units by mouth daily.    . clonazePAM (KLONOPIN) 0.5 MG tablet Take 1 tablet (0.5 mg total) by mouth 2 (two) times daily as needed for anxiety (and sleep). 60 tablet 3  . desvenlafaxine (PRISTIQ) 50 MG 24 hr tablet Take 1 tablet (50 mg total) by mouth daily. 30 tablet 3  . dexlansoprazole (DEXILANT) 60 MG capsule Take 1 capsule (60 mg total) by mouth daily. 30 capsule 3  . metoCLOPramide (REGLAN) 10 MG tablet Daily at bedtime and or when necessary before meals 60 tablet 1  . polyethylene glycol powder (GLYCOLAX/MIRALAX) powder Take 8.5 g by mouth daily. 255 g 0  . SYNTHROID 88 MCG tablet Take 1 tablet (88 mcg total) by mouth daily before breakfast. 30 tablet 3  . tamoxifen (NOLVADEX) 20 MG tablet Take 1 tablet (20 mg total) by mouth daily. 30 tablet 1  . zonisamide (ZONEGRAN) 50 MG capsule Take 4 capsules (200 mg total) by mouth daily. 60 capsule 1   No current facility-administered medications for this visit.   Facility-Administered Medications Ordered in Other Visits  Medication Dose Route Frequency Provider Last Rate Last Dose  . ferric gluconate (NULECIT) 125 mg in sodium chloride 0.9 % 100 mL IVPB  125 mg Intravenous Once B[facility], PA-C        REVIEW OF SYSTEMS:   Constitutional: Denies fevers, chills or abnormal night sweats Eyes: Denies blurriness of vision, double vision or watery eyes Ears, nose, mouth, throat, and face: Denies mucositis or sore throat Respiratory: Denies cough, dyspnea or wheezes Cardiovascular: Denies palpitation, chest discomfort or lower extremity swelling Gastrointestinal:  Denies nausea, heartburn or change in bowel habits Skin: Denies abnormal skin rashes Lymphatics: Denies new lymphadenopathy or easy  bruising Neurological:Denies numbness, tingling or new weaknesses Behavioral/Psych: Mood is stable, no new changes  Breast: Palpable abnormality in the left breast 2:00 position All other systems were reviewed with the patient and are negative.  PHYSICAL EXAMINATION: ECOG PERFORMANCE STATUS: 1 - Symptomatic but completely ambulatory  Filed Vitals:   07/22/15 1052  BP: 116/62  Pulse: 70  Temp: 99 F (37.2 C)  Resp: 18   Filed Weights   07/22/15 1052  Weight: 116 lb 12.8 oz (52.98 kg)    GENERAL:alert, no distress and comfortable SKIN: skin color, texture, turgor are normal, no rashes or significant lesions EYES: normal, conjunctiva are pink and non-injected, sclera clear OROPHARYNX:no exudate, no erythema and lips, buccal mucosa, and tongue normal  NECK: supple, thyroid normal size, non-tender, without nodularity LYMPH:  no palpable lymphadenopathy in the cervical, axillary or inguinal LUNGS: clear to  auscultation and percussion with normal breathing effort HEART: regular rate & rhythm and no murmurs and no lower extremity edema ABDOMEN:abdomen soft, non-tender and normal bowel sounds Musculoskeletal:no cyanosis of digits and no clubbing  PSYCH: alert & oriented x 3 with fluent speech NEURO: no focal motor/sensory deficits   LABORATORY DATA:  I have reviewed the data as listed Lab Results  Component Value Date   WBC 3.6* 04/09/2014   HGB 13.7 04/09/2014   HCT 42.4 04/09/2014   MCV 96.4 04/09/2014   PLT 183 04/09/2014   Lab Results  Component Value Date   NA 138 04/09/2014   K 4.1 04/09/2014   CL 104 04/09/2014   CO2 27 04/09/2014    RADIOGRAPHIC STUDIES: I have personally reviewed the radiological reports and agreed with the findings in the report.  ASSESSMENT AND PLAN:  Breast cancer of upper-outer quadrant of left female breast ([place]) Left breast biopsy 2:00 07/08/2015: Invasive ductal carcinoma with DCIS, LCIS, grade 1, ER 95%, PR 95%, HER-2 negative ratio  1.26, Ki-67 10% Mammogram and ultrasound: 06/28/2017Left breast upper outer quadrant: 3 mm group of amorphous calcifications in close proximity to circumscribed oval mass, lymph node; ultrasound 2:00 position 4 mm irregular hypoechoic mass. Breast MRI 07/22/2015: 17 x 6 x 6 mm area of enhancement in the left breast 2:00 position at the site of the previous biopsy. These are 2 masses separated by a clip. No other suspicious findings in the remainder of the breast or lymph nodes.  Recommendation: 1. Breast conserving surgery with sentinel lymph node biopsy (if genetics are negative) 2. Oncotype DX testing if the final tumor size is greater than 5 mm to determine if she needs chemotherapy 3. Adjuvant radiation therapy followed by 4. Adjuvant antiestrogen therapy with tamoxifen 20 mg daily 5 years  Because there is a delay from now until her surgery, I recommended starting tamoxifen therapy as of today. I counseled the risks and benefits of tamoxifen including the risk of hot flashes, myalgias, risk of blood clots, uterine hypertrophy/uterine cancer risks along with mood swings. She understands these risks and has consented to proceed with treatment.  Return to clinic after surgery to discuss the final pathology report.  All questions were answered. The patient knows to call the clinic with any problems, questions or concerns.    Rulon Eisenmenger, MD 07/22/2015

## 2015-07-22 NOTE — Assessment & Plan Note (Addendum)
Left breast biopsy 2:00 07/08/2015: Invasive ductal carcinoma with DCIS, LCIS, grade 1, ER 95%, PR 95%, HER-2 negative ratio 1.26, Ki-67 10% Mammogram and ultrasound: 06/28/2017Left breast upper outer quadrant: 3 mm group of amorphous calcifications in close proximity to circumscribed oval mass, lymph node; ultrasound 2:00 position 4 mm irregular hypoechoic mass. Breast MRI 07/22/2015: 17 x 6 x 6 mm area of enhancement in the left breast 2:00 position at the site of the previous biopsy. These are 2 masses separated by a clip. No other suspicious findings in the remainder of the breast or lymph nodes.  Recommendation: 1. Breast conserving surgery with sentinel lymph node biopsy (if genetics are negative) 2. Oncotype DX testing if the final tumor size is greater than 5 mm to determine if she needs chemotherapy 3. Adjuvant radiation therapy followed by 4. Adjuvant antiestrogen therapy with tamoxifen 20 mg daily 5 years  Because there is a delayed from now until her surgery, I recommended starting tamoxifen therapy as of today. I counseled the risks and benefits of tamoxifen including the risk of hot flashes, myalgias, risk of blood clots, uterine hypertrophy/uterine cancer risks along with mood swings. She understands these risks and has consented to proceed with treatment.  Return to clinic after surgery to discuss the final pathology report.

## 2015-07-23 ENCOUNTER — Encounter (HOSPITAL_COMMUNITY): Payer: 59 | Attending: Oncology | Admitting: Genetic Counselor

## 2015-07-23 ENCOUNTER — Encounter (HOSPITAL_COMMUNITY): Payer: Self-pay | Admitting: Genetic Counselor

## 2015-07-23 ENCOUNTER — Other Ambulatory Visit (HOSPITAL_COMMUNITY): Payer: Self-pay | Admitting: Oncology

## 2015-07-23 ENCOUNTER — Other Ambulatory Visit (HOSPITAL_COMMUNITY): Payer: 59

## 2015-07-23 DIAGNOSIS — Z8052 Family history of malignant neoplasm of bladder: Secondary | ICD-10-CM

## 2015-07-23 DIAGNOSIS — C50412 Malignant neoplasm of upper-outer quadrant of left female breast: Secondary | ICD-10-CM | POA: Diagnosis not present

## 2015-07-23 DIAGNOSIS — E039 Hypothyroidism, unspecified: Secondary | ICD-10-CM

## 2015-07-23 DIAGNOSIS — Z800 Family history of malignant neoplasm of digestive organs: Secondary | ICD-10-CM

## 2015-07-23 DIAGNOSIS — Z808 Family history of malignant neoplasm of other organs or systems: Secondary | ICD-10-CM | POA: Diagnosis not present

## 2015-07-23 DIAGNOSIS — Z803 Family history of malignant neoplasm of breast: Secondary | ICD-10-CM | POA: Diagnosis not present

## 2015-07-23 MED ORDER — SYNTHROID 88 MCG PO TABS: 88.0000 ug | ORAL_TABLET | Freq: Every day | ORAL | Status: DC

## 2015-07-23 MED FILL — SYNTHROID 88 MCG TABLET: 88 | 30 days supply | Qty: 30 | Fill #0

## 2015-07-23 NOTE — Progress Notes (Addendum)
REFERRING PROVIDER: Celene Squibb, MD [place],  [id number]   Shirley Lose, MD  Shirley Bookbinder, MD  PRIMARY PROVIDER:  Wende Neighbors, MD  PRIMARY REASON FOR VISIT:  1. Breast cancer of upper-outer quadrant of left female breast ([place])   2. Family history of breast cancer   3. Family history of colon cancer   4. Family history of thyroid cancer      HISTORY OF PRESENT ILLNESS:   [name], a 45 y.o. female, was seen for a [facility] cancer genetics consultation at the request of Dr. Lindi Adie due to a personal and family history of cancer.  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   In July 2017, at the age of 44, [name] was diagnosed with invasive ductal carcinoma of the left breast. The tumor is ER+/PR+/Her2-.  Surgery will be determined by her genetic testing.  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  She had a radiologist look at it several years after her daughter was born and nothing was seen, despite being able to feel it.  In July, she felt the lump again, and it felt bigger, so she had a mammogram which found the lump and was biopsied.  CANCER HISTORY:    Breast cancer of upper-outer quadrant of left female breast ([place])   07/08/2015 Initial Diagnosis Left breast biopsy 2:00: Invasive ductal carcinoma with DCIS, LCIS, grade 1, ER 95%, PR 95%, HER-2 negative ratio 1.26, Ki-67 10%,   07/08/2015 Mammogram Left breast upper outer quadrant: 3 mm group of amorphous calcifications in close proximity to circumscribed oval mass, lymph node; ultrasound 2:00 position 4 mm irregular hypoechoic mass    07/22/2015 Breast MRI 17 x 6 x 6 mm area of enhancement in the left breast 2:00 position at the site of the previous biopsy. These are 2 masses separated by a  clip. No other suspicious findings in the remainder of the breast or lymph nodes.     HORMONAL RISK FACTORS:  Menarche was at age 45.  First live birth at age 74.  OCP use for approximately 1-2 years.  Ovaries intact: yes.  Hysterectomy: no.  Menopausal status: premenopausal.  HRT use: 0 years. Colonoscopy: yes; patient has hemorhoids. Mammogram within the last year: yes. Number of breast biopsies: 1. Up to date with pelvic exams:  yes. Any excessive radiation exposure in the past:  no  Past Medical History  Diagnosis Date  . Gestational thrombocytopenia ([place])   . Ovarian cyst   . HSV (herpes simplex virus) infection   . Hypothyroidism   . Hashimoto's thyroiditis   . Endometriosis   . Headache   . Migraine   . Kidney stones   . GERD (gastroesophageal reflux disease)   . Family history of breast cancer   . Family history of colon cancer   . Family history of thyroid cancer   . Breast cancer [facility])     Past Surgical History  Procedure Laterality Date  . Breast surgery      implants  . Laparoscopy    . Wrist surgery    . Dilation and curettage of uterus    . Lithotripsy    . Colonoscopy N/A 08/11/2014    Procedure: COLONOSCOPY;  Surgeon: Rogene Houston, MD;  Location: [facility]  END[facility];  Service: Endoscopy;  Laterality: N/A;  730  . Upper gastrointestinal endoscopy      Social History   Social History  . Marital Status: Married    Spouse Name: N/A  . Number of Children: N/A  . Years of Education: N/A   Social History Main Topics  . Smoking status: Never Smoker   . Smokeless tobacco: Never Used  . Alcohol Use: No  . Drug Use: No  . Sexual Activity: Yes   Other Topics Concern  . None   Social History Narrative     FAMILY HISTORY:  We obtained a detailed, 4-generation family history.  Significant diagnoses are listed below: Family History  Problem Relation Age of Onset  . Hypertension Mother   . Thyroid disease Mother   . Barrett's esophagus Mother   .  Diabetes Father   . Thyroid disease Sister   . Thyroid cancer Sister 30    papillary  . Heart disease Maternal Grandmother   . Heart disease Maternal Grandfather   . Colon cancer Paternal Grandmother 15  . Bladder Cancer Paternal Grandfather 65  . Alcohol abuse Brother   . Esophageal cancer Maternal Uncle   . Brain cancer Paternal Uncle 16    Pineal tumor - benign  . Esophageal cancer Other 33    MGF's sister  . Breast cancer Other 28    MGMs sister    The patient has two young children who are cancer free.  She has two brothers and two sisters.  One sister has been diagnosed with papillary thyroid cancer at 62.  Her mother has had multiple breast biopsies, and had a complete hysterectomy including BSO in her 21s.  She has one brother who was recently diagnosed with esophageal cancer.  Both maternal grandparents are deceased from heart disease.  Her grandfather's sister was diagnosed with esophageal cancer at 21.  Dr. Donald Pore father is 82.  He had four brothers and a sister.  One brother died at 63 after surgery to remove a pineal gland tumor.  Her paternal grandmother had colon cancer at 39 and died at 52.  Her sister had breast cancer in hear early 32s.  Dr. Donald Pore paternal grandfather developed bladder cancer at 9 and died at 15.  Patient's maternal ancestors are of [place] and [place] descent, and paternal ancestors are of [place] and [place] descent. There is no reported Ashkenazi Jewish ancestry. There is no known consanguinity.  GENETIC COUNSELING ASSESSMENT: [name] is a 45 y.o. female with a personal and family history of cancer which is somewhat suggestive of a hereditary cancer syndrome and predisposition to cancer. We, therefore, discussed and recommended the following at today's visit.   DISCUSSION: We discussed that about 5-10% of breast cancer is hereditary with most cases due to BRCA mutations.  Looking at her family history, it is unlikely that we would find a  BRCA mutation.  We are more concerned about the moderate risk genes, such as ATM or CHEK2 mutations.  Dr. Donald Pore grandmother had colon cancer at a late age, and most likley was a sporadic event.  However, in order to get as much information about her risk for colon cancer, we discussed testing for hereditary colon cancer genes.  We reviewed the characteristics, features and inheritance patterns of hereditary cancer syndromes. We also discussed genetic testing, including the appropriate family members to test, the process of testing, insurance coverage and turn-around-time for results. We discussed the implications of a negative, positive and/or  variant of uncertain significant result. We recommended [name] pursue genetic testing for the comprehensive cancer gene panel. The Comprehensive Cancer Panel offered by [facility] includes sequencing and/or deletion duplication testing of the following 32 genes: APC, ATM, AXIN2, BARD1, BMPR1A, BRCA1, BRCA2, BRIP1, CDH1, CDK4, CDKN2A, CHEK2, EPCAM, FANCC, MLH1, MSH2, MSH6, MUTYH, NBN, PALB2, PMS2, POLD1, POLE, PTEN, RAD51C, RAD51D, SCG5/GREM1, SMAD4, STK11, TP53, VHL, and XRCC2.     Based on Dr. Donald Pore personal and family history of cancer, she meets medical criteria for genetic testing. Despite that she meets criteria, she may still have an out of pocket cost. We discussed that if her out of pocket cost for testing is over $100, the laboratory will call and confirm whether she wants to proceed with testing.  If the out of pocket cost of testing is less than $100 she will be billed by the genetic testing laboratory.   PLAN: After considering the risks, benefits, and limitations, [name]  provided informed consent to pursue genetic testing and the blood sample was sent to [facility] for analysis of the Comprehensive cancer panel.The test has been placed on a [facility], which will hopefully reduce the turn-around-time.  Results should be available within  approximately 2-3 weeks' time, at which point they will be disclosed by telephone to [name], as will any additional recommendations warranted by these results. [name] will receive a summary of her genetic counseling visit and a copy of her results once available. This information will also be available in Epic. We encouraged [name] to remain in contact with cancer genetics annually so that we can continuously update the family history and inform her of any changes in cancer genetics and testing that may be of benefit for her family. Dr. Donald Pore questions were answered to her satisfaction today. Our contact information was provided should additional questions or concerns arise.  Lastly, we encouraged [name] to remain in contact with cancer genetics annually so that we can continuously update the family history and inform her of any changes in cancer genetics and testing that may be of benefit for this family.   Dr.  Donald Pore questions were answered to her satisfaction today. Our contact information was provided should additional questions or concerns arise. Thank you for the referral and allowing [place] to share in the care of your patient.   [name], [name], [name] phone: [phone]  The patient was seen for a total of 30 minutes in face-to-face genetic counseling.  This patient was discussed with Drs. Magrinat, Lindi Adie and/or Burr Medico who agrees with the above.    _______________________________________________________________________ For Office Staff:  Number of people involved in session: 1 Was an Intern/ student involved with case: no

## 2015-07-24 ENCOUNTER — Encounter: Payer: Self-pay | Admitting: Radiation Oncology

## 2015-07-24 DIAGNOSIS — Z803 Family history of malignant neoplasm of breast: Secondary | ICD-10-CM | POA: Diagnosis not present

## 2015-07-24 DIAGNOSIS — C50412 Malignant neoplasm of upper-outer quadrant of left female breast: Secondary | ICD-10-CM | POA: Diagnosis not present

## 2015-07-24 NOTE — Addendum Note (Signed)
Addended by: Cheree Ditto on: 07/24/2015 08:48 AM   Modules accepted: Orders, Medications

## 2015-07-24 NOTE — Progress Notes (Signed)
Location of Breast Cancer: invasive ductal carcinoma of left breast  Histology per Pathology Report:    Receptor Status: ER(+), PR (+), Her2-neu (-), Ki-(10)  Did patient present with symptoms (if so, please note symptoms) or was this found on screening mammography?: felt an abnormality in the left breast upper outer quadrant accompanied by itching of the breast.  Past/Anticipated interventions by surgeon, if [facility]:ARWPTY conserving surgery with sentinel lymph node biopsy   Past/Anticipated interventions by medical oncology, if any: Chemotherapy:began tamoxifen 20 mg daily on 07/22/15  Lymphedema issues, if any:  no   Pain issues, if any:  no   SAFETY ISSUES:  Prior radiation? no  Pacemaker/ICD? no  Possible current pregnancy?no  Is the patient on methotrexate? no  Current Complaints / other details:  45 year old female. Married.    Joaquim Lai, RN 07/24/2015,2:22 PM

## 2015-07-27 ENCOUNTER — Encounter: Payer: Self-pay | Admitting: Radiation Oncology

## 2015-07-27 ENCOUNTER — Ambulatory Visit
Admission: RE | Admit: 2015-07-27 | Discharge: 2015-07-27 | Disposition: A | Discharge status: Home / Self Care | Payer: 59 | Source: Ambulatory Visit | Attending: Radiation Oncology | Admitting: Radiation Oncology

## 2015-07-27 VITALS — BP 107/62 | HR 74 | Resp 16 | Wt 118.0 lb

## 2015-07-27 DIAGNOSIS — Z170 Estrogen receptor positive status [ER+]: Secondary | ICD-10-CM | POA: Diagnosis not present

## 2015-07-27 DIAGNOSIS — D0592 Unspecified type of carcinoma in situ of left breast: Secondary | ICD-10-CM

## 2015-07-27 DIAGNOSIS — C50412 Malignant neoplasm of upper-outer quadrant of left female breast: Secondary | ICD-10-CM | POA: Insufficient documentation

## 2015-07-27 DIAGNOSIS — Z510 Encounter for antineoplastic radiation therapy: Secondary | ICD-10-CM | POA: Diagnosis not present

## 2015-07-27 HISTORY — DX: Malignant neoplasm of upper-outer quadrant of left female breast: C50.412

## 2015-07-27 NOTE — Progress Notes (Signed)
[place]         [phone] ________________________________  Initial outpatient Consultation  Name: [name] MRN: [id number]  Date: 07/27/2015  DOB: 12-25-1970  REFERRING PHYSICIAN: Celene Squibb, MD  DIAGNOSIS: 45 y.o. 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 In close proximity to the palpable marker in the left breast is a circumscribed oval mass likely representing a lymph node.  Physical exam at the time of the mammogram confirmed a small firm mass at approximately 2 o'clock.  Ultrasound targeted to the palpable area of concern at 2 o'clock, 5 cm from the nipple demonstrated a tiny irregular hypoechoic mass measuring 4 x 4 x 4 mm. There were normal-appearing lymph nodes are identified in the left axilla.  Ultrasound guided core needle biopsy was performed that same day.  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  The tumor was ER positive (95%), PR positive (95%), and Her-2 negative (HER2/CEP17 ratio 1.26) with a Ki-67 of 10%.  Subsequent breast MRI on 07/22/15.  This study showed susceptibility artifact from the biopsy clip in the upper outer quadrant. There was small additional area of decreased signal posterior to this, which may be from a small group of calcifications that lie posterior to the biopsy clip on mammography. Along the anterior margin of the biopsy clip, there was a small enhancing mass measuring approximately 6 x 5 x 5 mm. Just posterior to the biopsy clip there was another focal area of enhancement measuring 5  x 4 x 4 mm. The entire length of the area of abnormal enhancement, including the susceptibility artifact from the biopsy clip (which may obscure intervening enhancement) measures 18 mm from anterior-posterior.    She has been seen in consultation by Dr. Donne Hazel from general surgery, Dr. Lindi Adie from medical oncology and Roma Kayser from Genetic Counseling.  She presents today for consideration of possible radiation therapy in a breast conserving approach.  ROS: On review of systems, the patient reports that she is doing well overall. She is not experiencing concerns with pain or bruising since her biopsy. No other complaints are noted.   HORMONAL RISK FACTORS:  Menarche was at age 28.  First live birth at age 89.  OCP use for approximately 1-2 years.  Ovaries intact: yes.  Hysterectomy: no.  Menopausal status: premenopausal.  HRT use: 0 years. Colonoscopy: yes; patient has hemorhoids. Mammogram within the last year: yes. Number of breast biopsies: 1. Up to date with pelvic exams: yes. Any excessive radiation exposure in the past: no  PREVIOUS RADIATION THERAPY: No Past Medical History:  Past Medical History:  Diagnosis Date  . Breast cancer of upper-outer quadrant of left female breast ([facility])   . Endometriosis   . GERD (gastroesophageal reflux disease)   . Gestational thrombocytopenia ([facility])   . Hashimoto's thyroiditis   . Headache   . HSV (herpes simplex virus) infection   . Hypothyroidism   . Kidney stones   . Migraine   . Ovarian cyst     Past  Surgical History: Past Surgical History:  Procedure Laterality Date  . BREAST ENHANCEMENT SURGERY    . COLONOSCOPY N/A 08/11/2014   Procedure: COLONOSCOPY;  Surgeon: Rogene Houston, MD;  Location: [facility];  Service: Endoscopy;  Laterality: N/A;  730  . DILATION AND CURETTAGE OF UTERUS    . LAPAROSCOPY    . LITHOTRIPSY    . UPPER GASTROINTESTINAL ENDOSCOPY    . WRIST SURGERY      Social History:  Social  History   Social History  . Marital status: Married    Spouse name: N/A  . Number of children: N/A  . Years of education: N/A   Occupational History  . physician    Social History Main Topics  . Smoking status: Never Smoker  . Smokeless tobacco: Never Used  . Alcohol use No  . Drug use: No  . Sexual activity: Yes   Other Topics Concern  . Not on file   Social History Narrative  . No narrative on file  The patient is married and resides in [place], [place]. She is a Futures trader.   Family History: Family History  Problem Relation Age of Onset  . Hypertension Mother   . Thyroid disease Mother   . Barrett's esophagus Mother   . Diabetes Father   . Thyroid disease Sister   . Thyroid cancer Sister 30    papillary  . Heart disease Maternal Grandmother   . Heart disease Maternal Grandfather   . Colon cancer Paternal Grandmother 46  . Bladder Cancer Paternal Grandfather 78  . Alcohol abuse Brother   . Esophageal cancer Maternal Uncle   . Brain cancer Paternal Uncle 16    Pineal tumor - benign  . Esophageal cancer Other 41    MGF's sister  . Breast cancer Other 56    PGMs sister    MEDICATIONS:  Current Outpatient Prescriptions:  .  calcium carbonate 200 MG capsule, Take 250 mg by mouth daily., Disp: , Rfl:  .  cholecalciferol (VITAMIN D) 1000 UNITS tablet, Take 1,000 Units by mouth daily., Disp: , Rfl:  .  clonazePAM (KLONOPIN) 0.5 MG tablet, Take 1 tablet (0.5 mg total) by mouth 2 (two) times daily as needed for anxiety (and sleep)., Disp: 60 tablet, Rfl: 3 .  desvenlafaxine (PRISTIQ) 50 MG 24 hr tablet, Take 1 tablet (50 mg total) by mouth daily., Disp: 30 tablet, Rfl: 3 .  dexlansoprazole (DEXILANT) 60 MG capsule, Take 1 capsule (60 mg total) by mouth daily., Disp: 30 capsule, Rfl: 3 .  SYNTHROID 88 MCG tablet, Take 1 tablet (88 mcg total) by mouth daily before breakfast., Disp: 30 tablet, Rfl: 11 .  tamoxifen (NOLVADEX) 20 MG tablet, Take 1 tablet (20 mg  total) by mouth daily., Disp: 30 tablet, Rfl: 1 .  zonisamide (ZONEGRAN) 50 MG capsule, Take 4 capsules (200 mg total) by mouth daily., Disp: 60 capsule, Rfl: 1 No current facility-administered medications for this encounter.   Facility-Administered Medications Ordered in Other Encounters:  .  ferric gluconate (NULECIT) 125 mg in sodium chloride 0.9 % 100 mL IVPB, 125 mg, Intravenous, Once, [facility], PA-C   ALLERGIES:  No Known Allergies   PHYSICAL EXAM:  BP 107/62   Pulse 74   Resp 16   Wt 118 lb (53.5 kg)   LMP 07/06/2015   SpO2 100%   BMI 20.90 kg/m  In general this is a well appearing caucasian female in no acute distress. She's alert and oriented x4 and  appropriate throughout the examination. Cardiopulmonary assessment is negative for acute distress and she exhibits normal effort.   KPS = 100  100 - Normal; no complaints; no evidence of disease. 90   - Able to carry on normal activity; minor signs or symptoms of disease. 80   - Normal activity with effort; some signs or symptoms of disease. 56   - Cares for self; unable to carry on normal activity or to do active work. 60   - Requires occasional assistance, but is able to care for most of his personal needs. 50   - Requires considerable assistance and frequent medical care. 41   - Disabled; requires special care and assistance. 62   - Severely disabled; hospital admission is indicated although death not imminent. 45   - Very sick; hospital admission necessary; active supportive treatment necessary. 10   - Moribund; fatal processes progressing rapidly. 0     - Dead  Karnofsky DA, Abelmann [place], Craver LS and Burchenal JH [phone]) The use of the nitrogen mustards in the palliative treatment of carcinoma: with particular reference to bronchogenic carcinoma Cancer 1 634-56  LABORATORY DATA:  Lab Results  Component Value Date   WBC 3.6* 04/09/2014   HGB 13.7 04/09/2014   HCT 42.4 04/09/2014   MCV 96.4 04/09/2014    PLT 183 04/09/2014   Lab Results  Component Value Date   NA 138 04/09/2014   K 4.1 04/09/2014   CL 104 04/09/2014   CO2 27 04/09/2014   Lab Results  Component Value Date   ALT 11 04/09/2014   AST 18 04/09/2014   ALKPHOS 42 04/09/2014   BILITOT 0.5 04/09/2014     RADIOGRAPHY: Mr Jeri Cos Wo Contrast  07/13/2015  CLINICAL DATA:  Headache and nausea for 1 week.  Breast cancer. EXAM: MRI HEAD WITHOUT AND WITH CONTRAST TECHNIQUE: Multiplanar, multiecho pulse sequences of the brain and surrounding structures were obtained without and with intravenous contrast. CONTRAST:  91m MULTIHANCE GADOBENATE DIMEGLUMINE 529 MG/ML IV SOLN COMPARISON:  None. FINDINGS: No evidence for acute infarction, hemorrhage, mass lesion, hydrocephalus, or extra-axial fluid. Normal for age cerebral volume. No white matter disease. No vasogenic edema. Flow voids are maintained throughout the major intracranial vessels. No chronic hemorrhage. Normal pituitary and cerebellar tonsils. Slight prominence of the transverse alar ligament at the odontoid level, non worrisome. No upper cervical lesions. Visualized calvarium, skull base, and upper cervical osseous structures unremarkable. Scalp and extracranial soft tissues, orbits, sinuses, and mastoids show no acute process. Post infusion, no abnormal enhancement of the brain or meninges. Major dural venous sinuses are patent. Slight linear enhancement RIGHT posterior temporal lobe, subcortical white matter favored to represent a small venous malformation. IMPRESSION: Negative exam. No acute or focal intracranial abnormality. No evidence for metastatic disease. Electronically Signed   By: JStaci RighterM.D.   On: 07/13/2015 18:02   Mr Breast Bilateral W Wo Contrast  07/22/2015  CLINICAL DATA:  Patient initially presented with a palpable abnormality along the upper outer left breast. 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 There are several subtle amorphous calcifications in this portion of the breast on mammogram. LABS:  Labs not needed at time of imaging. EXAM: BILATERAL BREAST MRI WITH AND WITHOUT CONTRAST TECHNIQUE: Multiplanar, multisequence MR images of both breasts were obtained prior to and following the intravenous administration of 10 ml of MultiHance. THREE-DIMENSIONAL MR IMAGE RENDERING ON INDEPENDENT  WORKSTATION: Three-dimensional MR images were rendered by post-processing of the original MR data on an independent workstation. The three-dimensional MR images were interpreted, and findings are reported in the following complete MRI report for this study. Three dimensional images were evaluated at the independent DynaCad workstation COMPARISON:  Previous exam(s). FINDINGS: Breast composition: c. Heterogeneous fibroglandular tissue. Background parenchymal enhancement: Marked Right breast: No mass or abnormal enhancement. Left breast: Susceptibility artifact from the biopsy clip is noted in the upper outer quadrant. There is small additional area of decreased signal posterior to this, which may be from a small group of calcifications that lie posterior to the biopsy clip on mammography. Along the anterior margin of the biopsy clip, there is a small enhancing mass measuring approximately 6 x 5 x 5 mm. Just posterior to the biopsy clip there is another focal area of enhancement measuring 5 x 4 x 4 mm. The entire length of the area of abnormal enhancement, including the susceptibility artifact from the biopsy clip (which may obscure intervening enhancement) measures 18 mm from anterior-posterior. These areas of enhancement show rapid wash-in and washout kinetics, consistent with the known, biopsy proven, carcinoma. There are no other areas of abnormal left breast enhancement, which stand out from the background enhancement, to suggest additional areas of carcinoma. An enhancing,  normal size, lymph node is noted in the lateral left breast. Lymph nodes: No abnormal appearing lymph nodes. Ancillary findings:  None. IMPRESSION: 1. Known carcinoma in the upper outer quadrant of the left breast. There is enhancement within a small mass along the anterior margin of the biopsy clip susceptibility artifact, with another focal area of enhancement posterior to this, associated with a smaller area of susceptibility artifact that may be from a small group of calcifications. The span of the area of abnormal enhancement including the biopsy clip is 18 mm from anterior to posterior by 6 x 5 mm in the coronal plane. 2. There are no other areas of abnormal enhancement in the left breast to suggest additional areas of carcinoma. 3. No mass or abnormal enhancement in the right breast. 4. No abnormal appearing lymph nodes. RECOMMENDATION: Treatment as planned for the known left breast carcinoma. BI-RADS CATEGORY  6: Known biopsy-proven malignancy. Electronically Signed   By: Lajean Manes M.D.   On: 07/22/2015 11:55   US Breast Ltd Uni Left Inc Axilla  07/10/2015  ADDENDUM REPORT: 07/10/2015 17:21 ADDENDUM: Pathology results: Pathology results from the ultrasound-guided biopsy of the mass in the left breast revealed invasive ductal carcinoma, ductal carcinoma in situ, and lobular carcinoma in situ. This is concordant with the imaging findings. Dr. Whitney Muse is aware of the results. She has already contacted Dr. Donne Hazel for a surgical consultation. There is a 3 mm suspicious group of amorphous calcifications in the upper-outer left breast in the region of the biopsied mass. If desired, stereotactic guided biopsy of these calcifications may be performed versus localization and removal at time of surgery. The patient has been instructed to call the [facility] with any questions or concerns. Electronically Signed   By: Everlean Alstrom M.D.   On: 07/10/2015 17:21  07/10/2015  CLINICAL DATA:  45 year old  female presenting for a palpable area of concern in the upper-outer left breast. The patient feels that this has been increasing in size. EXAM: 2D DIGITAL DIAGNOSTIC BILATERAL MAMMOGRAM WITH IMPLANTS, CAD AND ADJUNCT TOMO The patient has retropectoral implants. Standard and implant displaced views were performed. LEFT BREAST ULTRASOUND COMPARISON:  Previous exam(s). ACR Breast Density  Category d: The breast tissue is extremely dense, which lowers the sensitivity of mammography. FINDINGS: There is a 3 mm group of a amorphous calcifications deep to the palpable marker in the upper outer left breast. In close proximity to the palpable marker in the left breast is a circumscribed oval mass likely representing a lymph node. No other suspicious calcifications, masses or areas of distortion are seen in the bilateral breasts. Mammographic images were processed with CAD. Physical exam of the palpable area of concern demonstrates a small firm mass at approximately 2 o'clock. Ultrasound targeted to the palpable area of concern at 2 o'clock, 5 cm from the nipple demonstrates a tiny irregular hypoechoic mass measuring 4 x 4 x 4 mm. Normal-appearing lymph nodes are identified in the left axilla. IMPRESSION: 1. There is a suspicious 4 mm mass in the left breast at 5 o'clock at the palpable site. 2. There is a 3 mm indeterminate group of calcifications in the upper-outer left breast near the palpable site described by the patient. It is unclear if these calcifications reside within the mass identified sonographically. 3.  No mammographic evidence of right breast malignancy. RECOMMENDATION: 1. Ultrasound-guided biopsy is recommended for the 4 mm left breast mass. This has been added on to today's schedule. 2. If the biopsy marking clip does not correspond with the site of calcifications in the left breast, a stereotactic biopsy will be recommended. I have discussed the findings and recommendations with the patient. Results were also  provided in writing at the conclusion of the visit. If applicable, a reminder letter will be sent to the patient regarding the next appointment. BI-RADS CATEGORY  4: Suspicious. Electronically Signed: By: Ammie Ferrier M.D. On: 07/08/2015 13:50   Mm Diag Breast W/implant Tomo Bilateral  07/10/2015  ADDENDUM REPORT: 07/10/2015 17:21 ADDENDUM: Pathology results: Pathology results from the ultrasound-guided biopsy of the mass in the left breast revealed invasive ductal carcinoma, ductal carcinoma in situ, and lobular carcinoma in situ. This is concordant with the imaging findings. Dr. Whitney Muse is aware of the results. She has already contacted Dr. Donne Hazel for a surgical consultation. There is a 3 mm suspicious group of amorphous calcifications in the upper-outer left breast in the region of the biopsied mass. If desired, stereotactic guided biopsy of these calcifications may be performed versus localization and removal at time of surgery. The patient has been instructed to call the [facility] with any questions or concerns. Electronically Signed   By: Everlean Alstrom M.D.   On: 07/10/2015 17:21  07/10/2015  CLINICAL DATA:  45 year old female presenting for a palpable area of concern in the upper-outer left breast. The patient feels that this has been increasing in size. EXAM: 2D DIGITAL DIAGNOSTIC BILATERAL MAMMOGRAM WITH IMPLANTS, CAD AND ADJUNCT TOMO The patient has retropectoral implants. Standard and implant displaced views were performed. LEFT BREAST ULTRASOUND COMPARISON:  Previous exam(s). ACR Breast Density Category d: The breast tissue is extremely dense, which lowers the sensitivity of mammography. FINDINGS: There is a 3 mm group of a amorphous calcifications deep to the palpable marker in the upper outer left breast. In close proximity to the palpable marker in the left breast is a circumscribed oval mass likely representing a lymph node. No other suspicious calcifications, masses or areas of  distortion are seen in the bilateral breasts. Mammographic images were processed with CAD. Physical exam of the palpable area of concern demonstrates a small firm mass at approximately 2 o'clock. Ultrasound targeted to the palpable area of  concern at 2 o'clock, 5 cm from the nipple demonstrates a tiny irregular hypoechoic mass measuring 4 x 4 x 4 mm. Normal-appearing lymph nodes are identified in the left axilla. IMPRESSION: 1. There is a suspicious 4 mm mass in the left breast at 5 o'clock at the palpable site. 2. There is a 3 mm indeterminate group of calcifications in the upper-outer left breast near the palpable site described by the patient. It is unclear if these calcifications reside within the mass identified sonographically. 3.  No mammographic evidence of right breast malignancy. RECOMMENDATION: 1. Ultrasound-guided biopsy is recommended for the 4 mm left breast mass. This has been added on to today's schedule. 2. If the biopsy marking clip does not correspond with the site of calcifications in the left breast, a stereotactic biopsy will be recommended. I have discussed the findings and recommendations with the patient. Results were also provided in writing at the conclusion of the visit. If applicable, a reminder letter will be sent to the patient regarding the next appointment. BI-RADS CATEGORY  4: Suspicious. Electronically Signed: By: Ammie Ferrier M.D. On: 07/08/2015 13:50   Mm Clip Placement Left  07/08/2015  CLINICAL DATA:  Post biopsy mammogram of the left breast for clip placement. EXAM: DIAGNOSTIC LEFT MAMMOGRAM POST ULTRASOUND BIOPSY COMPARISON:  Previous exam(s). FINDINGS: Mammographic images were obtained following ultrasound guided biopsy of a left breast mass at 2 o'clock. The ribbon shaped biopsy marking clip is appropriately positioned at 2 o'clock in the left breast. The biopsy marking clip appears to be approximately 1.6 cm anterior to the study of indeterminate calcifications  identified on the diagnostic mammogram. IMPRESSION: 1. Appropriate positioning of the biopsy marking clip at 2 o'clock in the left breast. 2. The calcifications identified in the upper-outer quadrant of the left breast do not appear to correspond with the ribbon shaped biopsy marking clip. These are difficult to visualize given the interval biopsy changes, however appear to be approximately 1.6 cm posterior to the clip. If the pathology results for the biopsied mass is benign, biopsy of these calcifications should be attempted, however due to the far posterior location stereotactic biopsy may not be possible. Alternatively, if the pathology results indicate that excision is necessary, the calcifications may either be removed at the time of surgery, or stereotactic biopsy can be performed prior to surgery. Final Assessment: Post Procedure Mammograms for Marker Placement Electronically Signed   By: Ammie Ferrier M.D.   On: 07/08/2015 15:35   [place] Lt Breast Bx W Loc Dev 1st Lesion Img Bx Spec US Guide  07/08/2015  CLINICAL DATA:  45 year old female presenting for ultrasound-guided biopsy of a palpable left breast mass. EXAM: ULTRASOUND GUIDED LEFT BREAST CORE NEEDLE BIOPSY COMPARISON:  Previous exam(s). FINDINGS: I met with the patient and we discussed the procedure of ultrasound-guided biopsy, including benefits and alternatives. We discussed the high likelihood of a successful procedure. We discussed the risks of the procedure, including infection, bleeding, tissue injury, clip migration, and inadequate sampling. Informed written consent was given. The usual time-out protocol was performed immediately prior to the procedure. Using sterile technique and 1% Lidocaine as local anesthetic, under direct ultrasound visualization, a 14 gauge spring-loaded device was used to perform biopsy of mass in the left breast at 2 o'clock using an inferior approach. At the conclusion of the procedure a ribbon shaped tissue marker  clip was deployed into the biopsy cavity. Follow up 2 view mammogram was performed and dictated separately. IMPRESSION: Ultrasound guided biopsy of  left breast mass at 2 o'clock. No apparent complications. Electronically Signed   By: Ammie Ferrier M.D.   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  She may be a good candidate for a breast conserving approach pending lumpectomy and sentinel lymph node dissection.  She does have sub-pectoral saline implants, and may be at risk of capsular contracture following radiation leading to discomfort, cosmetic deformity, and there is a 25% likelihood of requiring surgical revision of the prosthesis.  PLAN:Dr. Tammi Klippel spoke with the patient and her husband about the findings and work-up thus far.  We discussed the natural history of early stage left-sided breast cancer with previous sub-pectoral implants and general treatment, highlighting the role of radiotherapy in the management.  We discussed the available radiation techniques, and focused on the details of logistics and delivery. He recommends between 30-33 fractions over 6 to 6 1/2 weeks.  We reviewed the anticipated acute and late sequelae associated with radiation in this setting.  The patient was encouraged to ask questions that he answered to the best of his ability.  He filled out a patient counseling form during our discussion including treatment diagrams.  We retained a copy for our records.  The patient would like to proceed with breast conservation.  We will look forward to participating in her care and will follow up with her surgical findings and pathology. Dr. Tammi Klippel discusses that we would proceed with follow up about 2 weeks after surgery, simulation if appropriate at that time, and begin radiotherapy following her surgery if chemotherapy was not indicated. If  chemotherapy was indicated, we would proceed one month after completion of this.    The above documentation reflects my direct findings during this shared patient visit. Please see the separate note by Dr. Tammi Klippel on this date for the remainder of the patient's plan of care.    Carola Rhine, PAC       I have personally performed a face to face diagnostic evaluation on this patient and devised the assessment and plan.  See above  ------------------------------------------------   Tyler Pita, MD [facility] Director and Director of Stereotactic Radiosurgery Direct Dial: [phone]  Fax: [phone] [email or web address]  Skype  [email or web address]

## 2015-07-27 NOTE — Progress Notes (Signed)
See progress note under physician encounter. 

## 2015-07-29 MED FILL — BOTOX 100 UNITS VIAL: 100 | 84 days supply | Qty: 2 | Fill #0

## 2015-07-30 ENCOUNTER — Other Ambulatory Visit: Payer: Self-pay | Admitting: General Surgery

## 2015-07-30 DIAGNOSIS — C50412 Malignant neoplasm of upper-outer quadrant of left female breast: Secondary | ICD-10-CM

## 2015-08-02 NOTE — Addendum Note (Signed)
Encounter addended by: Tyler Pita, MD on: 08/02/2015  8:08 PM<BR>    Actions taken: Problem List reviewed, Sign clinical note

## 2015-08-03 ENCOUNTER — Ambulatory Visit: Payer: 59 | Admitting: Internal Medicine

## 2015-08-07 ENCOUNTER — Encounter: Payer: Self-pay | Admitting: Genetic Counselor

## 2015-08-07 ENCOUNTER — Telehealth: Payer: Self-pay | Admitting: Genetic Counselor

## 2015-08-07 ENCOUNTER — Ambulatory Visit: Payer: Self-pay | Admitting: Genetic Counselor

## 2015-08-07 DIAGNOSIS — C50412 Malignant neoplasm of upper-outer quadrant of left female breast: Secondary | ICD-10-CM

## 2015-08-07 DIAGNOSIS — Z1379 Encounter for other screening for genetic and chromosomal anomalies: Secondary | ICD-10-CM | POA: Insufficient documentation

## 2015-08-07 DIAGNOSIS — Z803 Family history of malignant neoplasm of breast: Secondary | ICD-10-CM

## 2015-08-07 DIAGNOSIS — Z808 Family history of malignant neoplasm of other organs or systems: Secondary | ICD-10-CM

## 2015-08-07 DIAGNOSIS — Z800 Family history of malignant neoplasm of digestive organs: Secondary | ICD-10-CM

## 2015-08-07 NOTE — Telephone Encounter (Signed)
Discussed that testing was essentially negative.  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  Other labs classify this similarly.  [facility] has seen the ATM VUS 35 times, so they are enquiring to see if this VUS is leaning toward benign vs pathogenic. I will let pt know once we hear.  Emailed copy of test result.

## 2015-08-07 NOTE — Telephone Encounter (Signed)
LM on VM that I had reached out the genetic testing laboratory representative about the TAT for patient's genetic testing.  He responded on 08/06/2015 at 10:30 PM stating that it appears the result will be out this weekend.  I told the patient on VM that I would try to call this weekend if I saw that her result came through.

## 2015-08-07 NOTE — Progress Notes (Addendum)
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 Please refer to our prior cancer genetics clinic note for more information regarding [name] medical, social and family histories, and our assessment and recommendations, at the time. [name] recent genetic test results were disclosed to her, as were recommendations warranted by these results. These results and recommendations are discussed in more detail below.  FAMILY HISTORY:  We obtained a detailed, 4-generation family history.  Significant diagnoses are listed below: Family History  Problem Relation Age of Onset  . Hypertension Mother   . Thyroid disease Mother   . Barrett's esophagus Mother   . Diabetes Father   . Thyroid disease Sister   . Thyroid cancer Sister 30    papillary  . Heart disease Maternal Grandmother   . Heart disease Maternal Grandfather   . Colon cancer Paternal Grandmother 71  . Bladder Cancer Paternal Grandfather 58  . Alcohol abuse Brother   . Esophageal cancer Maternal Uncle   . Brain cancer Paternal Uncle 16    Pineal tumor - benign  . Esophageal cancer Other 21    MGF's sister  . Breast cancer Other 21    PGMs sister    The patient has two young children who are cancer free.  She has two brothers and two sisters.  One sister has been diagnosed with papillary thyroid cancer at 78.  Her mother has had multiple breast biopsies, and had a complete hysterectomy including BSO in her 47s.  She has one brother who was recently diagnosed with esophageal cancer.  Both maternal grandparents are deceased from heart disease.  Her grandfather's sister was diagnosed with esophageal cancer at 75.  [name] father is 16.  He had four brothers and a sister.  One brother died at 34 after surgery to remove a pineal gland tumor.  Her paternal grandmother had colon cancer at 85  and died at 66.  Her sister had breast cancer in hear early 19s.  [name] paternal grandfather developed bladder cancer at 84 and died at 52.  Patient's maternal ancestors are of [place] and [place] descent, and paternal ancestors are of [place] and [place] descent. There is no reported Ashkenazi Jewish ancestry. There is no known consanguinity.  GENETIC TEST RESULTS: At the time of [name] visit, we recommended she pursue genetic testing of the Comprehensive cancer gene panel. The Comprehensive Cancer Panel offered by [facility] includes sequencing and/or deletion duplication testing of the following 32 genes: APC, ATM, AXIN2, BARD1, BMPR1A, BRCA1, BRCA2, BRIP1, CDH1, CDK4, CDKN2A, CHEK2, EPCAM, FANCC, MLH1, MSH2, MSH6, MUTYH, NBN, PALB2, PMS2, POLD1, POLE, PTEN, RAD51C, RAD51D, SCG5/GREM1, SMAD4, STK11, TP53, VHL, and XRCC2.   The report date is August 07, 2015.  Genetic testing was normal, and did not reveal a deleterious mutation in these genes. The test report has been scanned into EPIC and is located under the Molecular Pathology section of the Results Review tab.   We discussed with [name] that since the current genetic testing is not perfect, it is possible there may be a gene mutation in one of these genes that current testing cannot detect, but that chance is small. We also discussed, that it is possible that another gene that has not yet been discovered, or that we have not yet tested, is responsible for the cancer diagnoses in the family, and it is, therefore,  important to remain in touch with cancer genetics in the future so that we can continue to offer Dr.  Whitney Arnold the most up to date genetic testing.   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  At this time, it is unknown if these variants are associated with increased cancer risk or if they are a normal finding,  but most variants such as these get reclassified to being inconsequential. They should not be used to make medical management decisions. With time, we suspect the lab will determine the significance of these variants, if any. If we do learn more about it, we will try to contact [name] to discuss it further. However, it is important to stay in touch with [place] periodically and keep the address and phone number up to date.   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  Whitney Arnold likely does not have an increased risk for a future cancer due to a mutation in one of these genes. This normal test also suggests that [name] cancer was most likely not due to an inherited predisposition associated with one of these genes.  Most cancers happen by chance and this negative test suggests that her cancer falls into this category.  We, therefore, recommended she continue to follow the cancer management and screening guidelines provided by her oncology and primary healthcare provider.   RECOMMENDATIONS FOR FAMILY MEMBERS: Women in this family might be at some increased risk of developing cancer, over the general population risk, simply due to the family history of cancer. We recommended women in this family have a yearly mammogram beginning at age 44, or 76 years younger than the earliest onset of cancer, an an annual clinical breast exam, and perform monthly breast self-exams. Women in this family should also have a gynecological exam as recommended by their primary provider. All family members should have a colonoscopy by age 52.  FOLLOW-UP: Lastly, we discussed with [name] that cancer genetics is a rapidly advancing field and  it is possible that new genetic tests will be appropriate for her and/or her family members in the future. We encouraged her to remain in contact with cancer genetics on an annual basis so we can update her personal and family histories and let her know of advances in cancer genetics that may benefit this family.   Our contact number was provided. [name] questions were answered to her satisfaction, and she knows she is welcome to call us at anytime with additional questions or concerns.   Shirley Kayser, MS, [facility] Certified Genetic Counselor [name]

## 2015-08-11 ENCOUNTER — Encounter (HOSPITAL_BASED_OUTPATIENT_CLINIC_OR_DEPARTMENT_OTHER): Payer: Self-pay | Admitting: *Deleted

## 2015-08-14 ENCOUNTER — Other Ambulatory Visit: Payer: Self-pay | Admitting: General Surgery

## 2015-08-14 ENCOUNTER — Ambulatory Visit
Admission: RE | Admit: 2015-08-14 | Discharge: 2015-08-14 | Disposition: A | Discharge status: Home / Self Care | Payer: 59 | Source: Ambulatory Visit | Attending: General Surgery | Admitting: General Surgery

## 2015-08-14 DIAGNOSIS — C50412 Malignant neoplasm of upper-outer quadrant of left female breast: Secondary | ICD-10-CM

## 2015-08-14 DIAGNOSIS — C50912 Malignant neoplasm of unspecified site of left female breast: Secondary | ICD-10-CM | POA: Diagnosis not present

## 2015-08-14 NOTE — Progress Notes (Signed)
Pt's husband Gwyndolyn Saxon given 8 oz carton of boost breeze with written and verbal instructions (teach  Back) to drink by 730 am morning of surgery and no other liquid after midnight. He voiced understanding

## 2015-08-17 ENCOUNTER — Ambulatory Visit (HOSPITAL_BASED_OUTPATIENT_CLINIC_OR_DEPARTMENT_OTHER): Payer: 59 | Admitting: Anesthesiology

## 2015-08-17 ENCOUNTER — Ambulatory Visit (HOSPITAL_BASED_OUTPATIENT_CLINIC_OR_DEPARTMENT_OTHER)
Admission: RE | Admit: 2015-08-17 | Discharge: 2015-08-17 | Disposition: A | Discharge status: Home / Self Care | Payer: 59 | Source: Ambulatory Visit | Attending: General Surgery | Admitting: General Surgery

## 2015-08-17 ENCOUNTER — Ambulatory Visit
Admission: RE | Admit: 2015-08-17 | Discharge: 2015-08-17 | Disposition: A | Discharge status: Home / Self Care | Payer: 59 | Source: Ambulatory Visit | Attending: General Surgery | Admitting: General Surgery

## 2015-08-17 ENCOUNTER — Encounter (HOSPITAL_BASED_OUTPATIENT_CLINIC_OR_DEPARTMENT_OTHER): Payer: Self-pay | Admitting: Anesthesiology

## 2015-08-17 ENCOUNTER — Ambulatory Visit (HOSPITAL_COMMUNITY)
Admission: RE | Admit: 2015-08-17 | Discharge: 2015-08-17 | Disposition: A | Discharge status: Home / Self Care | Payer: 59 | Source: Ambulatory Visit | Attending: General Surgery | Admitting: General Surgery

## 2015-08-17 ENCOUNTER — Encounter (HOSPITAL_BASED_OUTPATIENT_CLINIC_OR_DEPARTMENT_OTHER)
Admission: RE | Disposition: A | Discharge status: Home / Self Care | Payer: Self-pay | Source: Ambulatory Visit | Attending: General Surgery

## 2015-08-17 DIAGNOSIS — K219 Gastro-esophageal reflux disease without esophagitis: Secondary | ICD-10-CM | POA: Insufficient documentation

## 2015-08-17 DIAGNOSIS — G8918 Other acute postprocedural pain: Secondary | ICD-10-CM | POA: Diagnosis not present

## 2015-08-17 DIAGNOSIS — C50412 Malignant neoplasm of upper-outer quadrant of left female breast: Secondary | ICD-10-CM | POA: Insufficient documentation

## 2015-08-17 DIAGNOSIS — F418 Other specified anxiety disorders: Secondary | ICD-10-CM | POA: Diagnosis not present

## 2015-08-17 DIAGNOSIS — D0582 Other specified type of carcinoma in situ of left breast: Secondary | ICD-10-CM | POA: Diagnosis not present

## 2015-08-17 DIAGNOSIS — N6092 Unspecified benign mammary dysplasia of left breast: Secondary | ICD-10-CM | POA: Diagnosis not present

## 2015-08-17 DIAGNOSIS — C50912 Malignant neoplasm of unspecified site of left female breast: Secondary | ICD-10-CM | POA: Diagnosis not present

## 2015-08-17 DIAGNOSIS — Z79899 Other long term (current) drug therapy: Secondary | ICD-10-CM | POA: Diagnosis not present

## 2015-08-17 DIAGNOSIS — R079 Chest pain, unspecified: Secondary | ICD-10-CM | POA: Diagnosis not present

## 2015-08-17 DIAGNOSIS — E039 Hypothyroidism, unspecified: Secondary | ICD-10-CM | POA: Insufficient documentation

## 2015-08-17 HISTORY — DX: Anxiety disorder, unspecified: F41.9

## 2015-08-17 HISTORY — PX: RADIOACTIVE SEED GUIDED PARTIAL MASTECTOMY WITH AXILLARY SENTINEL LYMPH NODE BIOPSY: SHX6520

## 2015-08-17 HISTORY — DX: Depression, unspecified: F32.A

## 2015-08-17 HISTORY — DX: Major depressive disorder, single episode, unspecified: F32.9

## 2015-08-17 SURGERY — RADIOACTIVE SEED GUIDED PARTIAL MASTECTOMY WITH AXILLARY SENTINEL LYMPH NODE BIOPSY
Anesthesia: General | Site: Breast | Laterality: Left

## 2015-08-17 MED ORDER — SODIUM CHLORIDE 0.9 % IJ SOLN
INTRAMUSCULAR | Status: AC
  Filled 2015-08-17: qty 10

## 2015-08-17 MED ORDER — FENTANYL CITRATE (PF) 100 MCG/2ML IJ SOLN
INTRAMUSCULAR | Status: DC | PRN
Start: 2015-08-17 — End: 2015-08-17
  Administered 2015-08-17: 100 ug via INTRAVENOUS

## 2015-08-17 MED ORDER — METHYLENE BLUE 0.5 % INJ SOLN
INTRAVENOUS | Status: AC
  Filled 2015-08-17: qty 10

## 2015-08-17 MED ORDER — FENTANYL CITRATE (PF) 100 MCG/2ML IJ SOLN
50.0000 ug | INTRAMUSCULAR | Status: DC | PRN
  Administered 2015-08-17: 50 ug via INTRAVENOUS

## 2015-08-17 MED ORDER — ACETAMINOPHEN 500 MG PO TABS
1000.0000 mg | ORAL_TABLET | ORAL | Status: AC
  Administered 2015-08-17: 1000 mg via ORAL

## 2015-08-17 MED ORDER — TECHNETIUM TC 99M SULFUR COLLOID FILTERED
1.0000 | Freq: Once | INTRAVENOUS | Status: AC | PRN
  Administered 2015-08-17: 1 via INTRADERMAL

## 2015-08-17 MED ORDER — OXYCODONE HCL 5 MG PO TABS: 5.0000 mg | ORAL_TABLET | Freq: Four times a day (QID) | ORAL | 0 refills | Status: AC | PRN

## 2015-08-17 MED ORDER — CELECOXIB 400 MG PO CAPS
400.0000 mg | ORAL_CAPSULE | ORAL | Status: AC
  Administered 2015-08-17: 400 mg via ORAL

## 2015-08-17 MED ORDER — CEFAZOLIN SODIUM-DEXTROSE 2-4 GM/100ML-% IV SOLN
INTRAVENOUS | Status: AC
  Filled 2015-08-17: qty 100

## 2015-08-17 MED ORDER — CELECOXIB 200 MG PO CAPS
ORAL_CAPSULE | ORAL | Status: AC
  Filled 2015-08-17: qty 2

## 2015-08-17 MED ORDER — OXYCODONE HCL 5 MG PO TABS
ORAL_TABLET | ORAL | Status: AC
  Filled 2015-08-17: qty 1

## 2015-08-17 MED ORDER — DEXAMETHASONE SODIUM PHOSPHATE 4 MG/ML IJ SOLN
INTRAMUSCULAR | Status: DC | PRN
  Administered 2015-08-17: 10 mg via INTRAVENOUS

## 2015-08-17 MED ORDER — MIDAZOLAM HCL 2 MG/2ML IJ SOLN
1.0000 mg | INTRAMUSCULAR | Status: DC | PRN
  Administered 2015-08-17: 2 mg via INTRAVENOUS

## 2015-08-17 MED ORDER — PROPOFOL 10 MG/ML IV BOLUS
INTRAVENOUS | Status: AC
  Filled 2015-08-17: qty 20

## 2015-08-17 MED ORDER — DEXAMETHASONE SODIUM PHOSPHATE 10 MG/ML IJ SOLN
INTRAMUSCULAR | Status: AC
  Filled 2015-08-17: qty 1

## 2015-08-17 MED ORDER — LIDOCAINE HCL (CARDIAC) 20 MG/ML IV SOLN
INTRAVENOUS | Status: DC | PRN
  Administered 2015-08-17: 50 mg via INTRAVENOUS

## 2015-08-17 MED ORDER — MIDAZOLAM HCL 2 MG/2ML IJ SOLN
INTRAMUSCULAR | Status: AC
  Filled 2015-08-17: qty 2

## 2015-08-17 MED ORDER — GABAPENTIN 300 MG PO CAPS
ORAL_CAPSULE | ORAL | Status: AC
  Filled 2015-08-17: qty 1

## 2015-08-17 MED ORDER — BUPIVACAINE HCL (PF) 0.25 % IJ SOLN
INTRAMUSCULAR | Status: DC | PRN
  Administered 2015-08-17: 7 mL

## 2015-08-17 MED ORDER — BUPIVACAINE HCL (PF) 0.25 % IJ SOLN
INTRAMUSCULAR | Status: AC
  Filled 2015-08-17: qty 30

## 2015-08-17 MED ORDER — SCOPOLAMINE 1 MG/3DAYS TD PT72: 1.0000 | MEDICATED_PATCH | Freq: Once | TRANSDERMAL | Status: DC | PRN

## 2015-08-17 MED ORDER — BUPIVACAINE-EPINEPHRINE (PF) 0.5% -1:200000 IJ SOLN
INTRAMUSCULAR | Status: DC | PRN
  Administered 2015-08-17: 30 mL via PERINEURAL

## 2015-08-17 MED ORDER — LACTATED RINGERS IV SOLN
INTRAVENOUS | Status: DC
  Administered 2015-08-17: 10 mL/h via INTRAVENOUS
  Administered 2015-08-17: 13:00:00 via INTRAVENOUS

## 2015-08-17 MED ORDER — LIDOCAINE 2% (20 MG/ML) 5 ML SYRINGE
INTRAMUSCULAR | Status: AC
  Filled 2015-08-17: qty 5

## 2015-08-17 MED ORDER — FENTANYL CITRATE (PF) 100 MCG/2ML IJ SOLN: 25.0000 ug | INTRAMUSCULAR | Status: DC | PRN

## 2015-08-17 MED ORDER — PROMETHAZINE HCL 25 MG/ML IJ SOLN: 6.2500 mg | INTRAMUSCULAR | Status: DC | PRN

## 2015-08-17 MED ORDER — ACETAMINOPHEN 500 MG PO TABS
ORAL_TABLET | ORAL | Status: AC
  Filled 2015-08-17: qty 2

## 2015-08-17 MED ORDER — ONDANSETRON HCL 4 MG/2ML IJ SOLN
INTRAMUSCULAR | Status: AC
  Filled 2015-08-17: qty 2

## 2015-08-17 MED ORDER — PROPOFOL 10 MG/ML IV BOLUS
INTRAVENOUS | Status: DC | PRN
  Administered 2015-08-17: 200 mg via INTRAVENOUS

## 2015-08-17 MED ORDER — FENTANYL CITRATE (PF) 100 MCG/2ML IJ SOLN
INTRAMUSCULAR | Status: AC
  Filled 2015-08-17: qty 2

## 2015-08-17 MED ORDER — CEFAZOLIN SODIUM-DEXTROSE 2-4 GM/100ML-% IV SOLN
2.0000 g | INTRAVENOUS | Status: AC
Start: 2015-08-17 — End: 2015-08-17
  Administered 2015-08-17: 2 g via INTRAVENOUS

## 2015-08-17 MED ORDER — GABAPENTIN 300 MG PO CAPS
300.0000 mg | ORAL_CAPSULE | ORAL | Status: AC
  Administered 2015-08-17: 300 mg via ORAL

## 2015-08-17 MED ORDER — GLYCOPYRROLATE 0.2 MG/ML IJ SOLN: 0.2000 mg | Freq: Once | INTRAMUSCULAR | Status: DC | PRN

## 2015-08-17 MED ORDER — ONDANSETRON HCL 4 MG/2ML IJ SOLN
INTRAMUSCULAR | Status: DC | PRN
  Administered 2015-08-17: 4 mg via INTRAVENOUS

## 2015-08-17 MED ORDER — MIDAZOLAM HCL 5 MG/5ML IJ SOLN
INTRAMUSCULAR | Status: DC | PRN
  Administered 2015-08-17: 2 mg via INTRAVENOUS

## 2015-08-17 MED ORDER — OXYCODONE HCL 5 MG PO TABS
5.0000 mg | ORAL_TABLET | Freq: Once | ORAL | Status: AC
  Administered 2015-08-17: 5 mg via ORAL

## 2015-08-17 SURGICAL SUPPLY — 61 items
BINDER BREAST LRG (GAUZE/BANDAGES/DRESSINGS) IMPLANT
BINDER BREAST MEDIUM (GAUZE/BANDAGES/DRESSINGS) ×3 IMPLANT
BINDER BREAST XLRG (GAUZE/BANDAGES/DRESSINGS) IMPLANT
BINDER BREAST XXLRG (GAUZE/BANDAGES/DRESSINGS) IMPLANT
BLADE SURG 15 STRL LF DISP TIS (BLADE) ×1 IMPLANT
BLADE SURG 15 STRL SS (BLADE) ×2
CANISTER SUC SOCK COL 7IN (MISCELLANEOUS) IMPLANT
CANISTER SUCT 1200ML W/VALVE (MISCELLANEOUS) IMPLANT
CHLORAPREP W/TINT 26ML (MISCELLANEOUS) ×3 IMPLANT
CLIP TI WIDE RED SMALL 6 (CLIP) ×3 IMPLANT
CLOSURE WOUND 1/2 X4 (GAUZE/BANDAGES/DRESSINGS) ×1
COVER BACK TABLE 60X90IN (DRAPES) ×3 IMPLANT
COVER MAYO STAND STRL (DRAPES) ×3 IMPLANT
COVER PROBE W GEL 5X96 (DRAPES) ×3 IMPLANT
DECANTER SPIKE VIAL GLASS SM (MISCELLANEOUS) IMPLANT
DEVICE DUBIN W/COMP PLATE 8390 (MISCELLANEOUS) ×3 IMPLANT
DRAPE LAPAROSCOPIC ABDOMINAL (DRAPES) ×3 IMPLANT
DRAPE UTILITY XL STRL (DRAPES) ×3 IMPLANT
ELECT COATED BLADE 2.86 ST (ELECTRODE) ×3 IMPLANT
ELECT REM PT RETURN 9FT ADLT (ELECTROSURGICAL) ×3
ELECTRODE REM PT RTRN 9FT ADLT (ELECTROSURGICAL) ×1 IMPLANT
GLOVE BIO SURGEON STRL SZ 6.5 (GLOVE) ×2 IMPLANT
GLOVE BIO SURGEON STRL SZ7 (GLOVE) ×6 IMPLANT
GLOVE BIO SURGEONS STRL SZ 6.5 (GLOVE) ×1
GLOVE BIOGEL PI IND STRL 7.0 (GLOVE) ×1 IMPLANT
GLOVE BIOGEL PI IND STRL 7.5 (GLOVE) ×1 IMPLANT
GLOVE BIOGEL PI INDICATOR 7.0 (GLOVE) ×2
GLOVE BIOGEL PI INDICATOR 7.5 (GLOVE) ×2
GOWN STRL REUS W/ TWL LRG LVL3 (GOWN DISPOSABLE) ×2 IMPLANT
GOWN STRL REUS W/TWL LRG LVL3 (GOWN DISPOSABLE) ×4
HEMOSTAT ARISTA ABSORB 3G PWDR (MISCELLANEOUS) ×3 IMPLANT
ILLUMINATOR WAVEGUIDE N/F (MISCELLANEOUS) ×3 IMPLANT
KIT MARKER MARGIN INK (KITS) ×3 IMPLANT
LIGHT WAVEGUIDE WIDE FLAT (MISCELLANEOUS) IMPLANT
LIQUID BAND (GAUZE/BANDAGES/DRESSINGS) ×3 IMPLANT
NDL SAFETY ECLIPSE 18X1.5 (NEEDLE) IMPLANT
NEEDLE HYPO 18GX1.5 SHARP (NEEDLE)
NEEDLE HYPO 25X1 1.5 SAFETY (NEEDLE) ×3 IMPLANT
NS IRRIG 1000ML POUR BTL (IV SOLUTION) ×3 IMPLANT
PACK BASIN DAY SURGERY FS (CUSTOM PROCEDURE TRAY) ×3 IMPLANT
PENCIL BUTTON HOLSTER BLD 10FT (ELECTRODE) ×3 IMPLANT
SHEET MEDIUM DRAPE 40X70 STRL (DRAPES) IMPLANT
SLEEVE SCD COMPRESS KNEE MED (MISCELLANEOUS) ×3 IMPLANT
SPONGE LAP 4X18 X RAY DECT (DISPOSABLE) ×3 IMPLANT
STOCKINETTE IMPERVIOUS LG (DRAPES) IMPLANT
STRIP CLOSURE SKIN 1/2X4 (GAUZE/BANDAGES/DRESSINGS) ×2 IMPLANT
SUT ETHILON 2 0 FS 18 (SUTURE) IMPLANT
SUT MNCRL AB 4-0 PS2 18 (SUTURE) ×3 IMPLANT
SUT MON AB 5-0 PS2 18 (SUTURE) ×3 IMPLANT
SUT SILK 2 0 SH (SUTURE) ×3 IMPLANT
SUT VIC AB 2-0 SH 27 (SUTURE) ×2
SUT VIC AB 2-0 SH 27XBRD (SUTURE) ×1 IMPLANT
SUT VIC AB 3-0 SH 27 (SUTURE) ×2
SUT VIC AB 3-0 SH 27X BRD (SUTURE) ×1 IMPLANT
SUT VIC AB 5-0 PS2 18 (SUTURE) IMPLANT
SYR CONTROL 10ML LL (SYRINGE) ×3 IMPLANT
TOWEL OR 17X24 6PK STRL BLUE (TOWEL DISPOSABLE) ×3 IMPLANT
TOWEL OR NON WOVEN STRL DISP B (DISPOSABLE) ×3 IMPLANT
TUBE CONNECTING 20'X1/4 (TUBING)
TUBE CONNECTING 20X1/4 (TUBING) IMPLANT
YANKAUER SUCT BULB TIP NO VENT (SUCTIONS) IMPLANT

## 2015-08-17 NOTE — Transfer of Care (Signed)
Immediate Anesthesia Transfer of Care Note  Patient: [name]  Procedure(s) Performed: Procedure(s): BRACKETED RADIOACTIVE SEED GUIDED LEFT BREAST LUMPECTOMY WITH AXILLARY SENTINEL LYMPH NODE BIOPSY (Left)  Patient Location: PACU  Anesthesia Type:GA combined with regional for post-op pain  Level of Consciousness: sedated and patient cooperative  Airway & Oxygen Therapy: Patient Spontanous Breathing and Patient connected to face mask oxygen  Post-op Assessment: Report given to RN and Post -op Vital signs reviewed and stable  Post vital signs: Reviewed and stable  Last Vitals:  Vitals:   08/17/15 1025 08/17/15 1030  BP:  118/60  Pulse: 85 83  Resp: 14 (!) 25  Temp:      Last Pain:  Vitals:   08/17/15 0942  TempSrc: Oral         Complications: No apparent anesthesia complications

## 2015-08-17 NOTE — Interval H&P Note (Signed)
History and Physical Interval Note:  08/17/2015 12:10 PM  [name]  has presented today for surgery, with the diagnosis of LEFT BREAST CANCER  The various methods of treatment have been discussed with the patient and family. After consideration of risks, benefits and other options for treatment, the patient has consented to  Procedure(s): BRACKETED RADIOACTIVE SEED GUIDED LEFT BREAST LUMPECTOMY WITH AXILLARY SENTINEL LYMPH NODE BIOPSY (Left) as a surgical intervention .  The patient's history has been reviewed, patient examined, no change in status, stable for surgery.  I have reviewed the patient's chart and labs.  Questions were answered to the patient's satisfaction.     [name]

## 2015-08-17 NOTE — Anesthesia Procedure Notes (Signed)
Anesthesia Procedure Image    

## 2015-08-17 NOTE — H&P (Signed)
45 yof who presents new diagnosis left breast cancer. she has had a small nodule uoq for several years since birth of her daughter that is now larger. this had been evaluated before. she underwent mm/us, was due in october. she has d density breasts. she does have retropectoral implants placed in late 20s. there is a 3 mm upper outer left breast area of amorphous calcifications, in close proximity there is a circumscribed oval mass corresponding to palpable mass. otherwise negative. she then had [place] that shows at 2 oclock a 4x4x4 mm mass 5 cm from nipple, left axilla is negative. she underwent US guided biopsy of this mass that shows idc with dcis, lcis, grade I, her 2 negative, er/pr pos at 95%, Ki is 10%. she has no nipple discharge. Dr Whitney Muse is a local medical oncologist and she is here with her husband.  Other Problems Ventura Sellers, [place]; 07/15/2015 4:18 PM) Gastroesophageal Reflux Disease Kidney Stone Lump In Breast Migraine Headache Thyroid Disease  Past Surgical History Ventura Sellers, CMA; 07/15/2015 4:18 PM) Breast Augmentation Bilateral. Breast Biopsy Left.  Diagnostic Studies History Ventura Sellers, [place]; 07/15/2015 4:18 PM) Colonoscopy within last year Mammogram within last year Pap Smear 1-5 years ago  Allergies Ventura Sellers, CMA; 07/15/2015 4:18 PM) No Known Drug Allergies07/05/2015  Medication History Ventura Sellers, [place]; 07/15/2015 4:21 PM) ClonazePAM (0.5MG  Tablet, Oral) Active. Pristiq (50MG  Tablet ER 24HR, Oral) Active. Dexilant (60MG  Capsule DR, Oral) Active. Synthroid (88MCG Tablet, Oral) Active. Zonisamide (Oral) Specific dose unknown - Active. Iron Polysacch Cmplx-B12-FA (150-0.025-1MG  Capsule, Oral) Active. Vitamin D (1000UNIT Tablet, Oral) Active. Calcium Carbonate (200MG  Capsule, Oral) Active. Medications Reconciled  Social History Ventura Sellers, [place]; 07/15/2015 4:18 PM) Caffeine use Carbonated beverages. No  alcohol use No drug use Tobacco use Never smoker.  Family History Ventura Sellers, [place]; 07/15/2015 4:18 PM) Alcohol Abuse Brother. Cancer Sister. Depression Brother. Diabetes Mellitus Father. Heart Disease Father. Hypertension Mother. Migraine Headache Brother, Mother, Sister. Thyroid problems Mother, Sister.  Pregnancy / Birth History Ventura Sellers, [place]; 07/15/2015 4:18 PM) Age at menarche 73 years. Gravida 4 Length (months) of breastfeeding 7-12 Maternal age 90-40 Para 2 Regular periods  Review of Systems Sharyn Lull R. Brooks CMA; 07/15/2015 4:18 PM) General Not Present- Appetite Loss, Chills, Fatigue, Fever, Night Sweats, Weight Gain and Weight Loss. Skin Not Present- Change in Wart/Mole, Dryness, Hives, Jaundice, New Lesions, Non-Healing Wounds, Rash and Ulcer. HEENT Not Present- Earache, Hearing Loss, Hoarseness, Nose Bleed, Oral Ulcers, Ringing in the Ears, Seasonal Allergies, Sinus Pain, Sore Throat, Visual Disturbances, Wears glasses/contact lenses and Yellow Eyes. Respiratory Not Present- Bloody sputum, Chronic Cough, Difficulty Breathing, Snoring and Wheezing. Breast Present- Breast Mass. Not Present- Breast Pain, Nipple Discharge and Skin Changes. Cardiovascular Not Present- Chest Pain, Difficulty Breathing Lying Down, Leg Cramps, Palpitations, Rapid Heart Rate, Shortness of Breath and Swelling of Extremities. Gastrointestinal Not Present- Abdominal Pain, Bloating, Bloody Stool, Change in Bowel Habits, Chronic diarrhea, Constipation, Difficulty Swallowing, Excessive gas, Gets full quickly at meals, Hemorrhoids, Indigestion, Nausea, Rectal Pain and Vomiting. Female Genitourinary Not Present- Frequency, Nocturia, Painful Urination, Pelvic Pain and Urgency. Musculoskeletal Not Present- Back Pain, Joint Pain, Joint Stiffness, Muscle Pain, Muscle Weakness and Swelling of Extremities. Neurological Present- Headaches. Not Present- Decreased Memory, Fainting,  Numbness, Seizures, Tingling, Tremor, Trouble walking and Weakness. Psychiatric Not Present- Anxiety, Bipolar, Change in Sleep Pattern, Depression, Fearful and Frequent crying. Endocrine Not Present- Cold Intolerance, Excessive Hunger, Hair Changes, Heat Intolerance, Hot flashes and New Diabetes.  Hematology Not Present- Blood Thinners, Easy Bruising, Excessive bleeding, Gland problems, HIV and Persistent Infections.   Physical Exam Rolm Bookbinder MD; 07/15/2015 5:19 PM) General Mental Status-Alert. Orientation-Oriented X3. Chest and Lung Exam Chest and lung exam reveals -quiet, even and easy respiratory effort with no use of accessory muscles and on auscultation, normal breath sounds, no adventitious sounds and normal vocal resonance. Breast Nipples-No Discharge., no mass Cardiovascular Cardiovascular examination reveals -normal heart sounds, regular rate and rhythm with no murmurs. Lymphatic Head & Neck General Head & Neck Lymphatics: Bilateral - Description - Normal. Axillary General Axillary Region: Bilateral - Description - Normal. Note: no [place] adenopathy   Assessment & Plan Rolm Bookbinder MD; 07/15/2015 5:22 PM) BREAST CANCER OF UPPER-OUTER QUADRANT OF LEFT FEMALE BREAST (C50.412) Left breast seed guided lumpectomy, left axillary sentinel node biopsy

## 2015-08-17 NOTE — Anesthesia Preprocedure Evaluation (Addendum)
Anesthesia Evaluation  Patient identified by MRN, date of birth, ID band Patient awake    Reviewed: Allergy & Precautions, NPO status , Patient's Chart, lab work & pertinent test results  Airway Mallampati: II  TM Distance: >3 FB Neck ROM: Full    Dental no notable dental hx.    Pulmonary neg pulmonary ROS,    Pulmonary exam normal breath sounds clear to auscultation       Cardiovascular negative cardio ROS Normal cardiovascular exam Rhythm:Regular Rate:Normal     Neuro/Psych  Headaches, PSYCHIATRIC DISORDERS Anxiety Depression    GI/Hepatic Neg liver ROS, GERD  ,  Endo/Other  Hypothyroidism   Renal/GU Renal disease  negative genitourinary   Musculoskeletal negative musculoskeletal ROS (+)   Abdominal   Peds negative pediatric ROS (+)  Hematology negative hematology ROS (+)   Anesthesia Other Findings   Reproductive/Obstetrics negative OB ROS                             Anesthesia Physical Anesthesia Plan  ASA: II  Anesthesia Plan: General   Post-op Pain Management: GA combined w/ Regional for post-op pain   Induction: Intravenous  Airway Management Planned: LMA  Additional Equipment:   Intra-op Plan:   Post-operative Plan: Extubation in OR  Informed Consent: I have reviewed the patients History and Physical, chart, labs and discussed the procedure including the risks, benefits and alternatives for the proposed anesthesia with the patient or authorized representative who has indicated his/her understanding and acceptance.   Dental advisory given  Plan Discussed with: CRNA  Anesthesia Plan Comments: (Discussed r/b/a of pectoralis/serratus block including failure, bleeding, infection, and nerve damage.)      Anesthesia Quick Evaluation

## 2015-08-17 NOTE — Op Note (Addendum)
Preoperative diagnosis: left breast cancer, clinical stage 1 Postoperative diagnosis: same as above Procedure: 1. Left breast seed guided lumpectomy 2. Left axillary sentinel node biopsy Surgeon Dr Serita Grammes Anes general with pectoral block EBL: minimal Comps none Specimen:  1. Left breast marked with paint 2. Left axillary nodes with highest count of 1903 3. Additional medial margin marked short superior, long lateral, double deep Sponge count correct at completion dispo to recovery stable  Indications: This is a 12 yof who has newly diagnosed left breast cancer.  She is a Futures trader. We discussed proceeding with lumpectomy/sn biopsy. She had radioactive seed placed prior to beginning. I discussed this with the radiologist. I had these mm in the OR.   Procedure: After informed consent was obtained the patient was taken to the OR. She was injected with technetium in the standard periareolar fashion. She underwent a pectoral block. She was given anitibiotics. SCDs were in place. She was prepped and draped in the standard sterile surgical fashion. A timeout was performed. I then made located the seed in the left uoq.  I infiltrated marcaine and made an axillary incision. I used the lighted retractor system to tunnel to the lesion. I then removed the seed with an attempt to get a clear margin. The implant was visible and not disturbed. The posterior margin is the muscle and the anterior margin is the skin.  I did confirm removal of the clip and seed with mammography.  I placed clips in the cavity.I went through the axillary fascia. I identified the sentinel nodes with the highest count as above. There was no background radioactivity. I obtained hemostasis. I then closed the fascia with 2-0 vicryl. I placed arista in the entire cavity. The skin was closed with 3-0 vicryl and 5-0 monocryl. Glue and steristrips were placed A breast binder was placed. She was extubated and  transferred to recovery stable

## 2015-08-17 NOTE — Discharge Instructions (Signed)
[place] Office Phone Number [phone]   POST OP INSTRUCTIONS  Always review your discharge instruction sheet given to you by the facility where your surgery was performed.  IF YOU HAVE DISABILITY OR FAMILY LEAVE FORMS, YOU MUST BRING THEM TO THE OFFICE FOR PROCESSING.  DO NOT GIVE THEM TO YOUR DOCTOR.  1. A prescription for pain medication may be given to you upon discharge.  Take your pain medication as prescribed, if needed.  If narcotic pain medicine is not needed, then you may take acetaminophen (Tylenol), naprosyn (Alleve) or ibuprofen (Advil) as needed. 2. Take your usually prescribed medications unless otherwise directed 3. If you need a refill on your pain medication, please contact your pharmacy.  They will contact our office to request authorization.  Prescriptions will not be filled after 5pm or on week-ends. 4. You should eat very light the first 24 hours after surgery, such as soup, crackers, pudding, etc.  Resume your normal diet the day after surgery. 5. Most patients will experience some swelling and bruising in the breast.  Ice packs and a good support bra will help.  Wear the breast binder provided or a sports bra for 72 hours day and night.  After that wear a sports bra during the day until you return to the office. Swelling and bruising can take several days to resolve.  6. It is common to experience some constipation if taking pain medication after surgery.  Increasing fluid intake and taking a stool softener will usually help or prevent this problem from occurring.  A mild laxative (Milk of Magnesia or Miralax) should be taken according to package directions if there are no bowel movements after 48 hours. 7. Unless discharge instructions indicate otherwise, you may remove your bandages 48 hours after surgery and you may shower at that time.  You may have steri-strips (small skin tapes) in place directly over the incision.  These strips should be left on the  skin for 7-10 days and will come off on their own.  If your surgeon used skin glue on the incision, you may shower in 24 hours.  The glue will flake off over the next 2-3 weeks.  Any sutures or staples will be removed at the office during your follow-up visit. 8. ACTIVITIES:  You may resume regular daily activities (gradually increasing) beginning the next day.  Wearing a good support bra or sports bra minimizes pain and swelling.  You may have sexual intercourse when it is comfortable. a. You may drive when you no longer are taking prescription pain medication, you can comfortably wear a seatbelt, and you can safely maneuver your car and apply brakes. b. RETURN TO WORK:  ______________________________________________________________________________________ 9. You should see your doctor in the office for a follow-up appointment approximately two weeks after your surgery.  Your doctors nurse will typically make your follow-up appointment when she calls you with your pathology report.  Expect your pathology report 3-4 business days after your surgery.  You may call to check if you do not hear from [place] after three days. 10. OTHER INSTRUCTIONS: _______________________________________________________________________________________________ _____________________________________________________________________________________________________________________________________ _____________________________________________________________________________________________________________________________________ _____________________________________________________________________________________________________________________________________  WHEN TO CALL DR WAKEFIELD: 1. Fever over 101.0 2. Nausea and/or vomiting. 3. Extreme swelling or bruising. 4. Continued bleeding from incision. 5. Increased pain, redness, or drainage from the incision.  The clinic staff is available to answer your questions during regular  business hours.  Please dont hesitate to call and ask to speak to one of the nurses for clinical concerns.  If you have a medical emergency, go to the nearest emergency room or call 911.  A surgeon from [facility] is always on call at the hospital.  For further questions, please visit [email or web address]    Post Anesthesia Home Care Instructions  Activity: Get plenty of rest for the remainder of the day. A responsible adult should stay with you for 24 hours following the procedure.  For the next 24 hours, DO NOT: -Drive a car -Paediatric nurse -Drink alcoholic beverages -Take any medication unless instructed by your physician -Make any legal decisions or sign important papers.  Meals: Start with liquid foods such as gelatin or soup. Progress to regular foods as tolerated. Avoid greasy, spicy, heavy foods. If nausea and/or vomiting occur, drink only clear liquids until the nausea and/or vomiting subsides. Call your physician if vomiting continues.  Special Instructions/Symptoms: Your throat may feel dry or sore from the anesthesia or the breathing tube placed in your throat during surgery. If this causes discomfort, gargle with warm salt water. The discomfort should disappear within 24 hours.  If you had a scopolamine patch placed behind your ear for the management of post- operative nausea and/or vomiting:  1. The medication in the patch is effective for 72 hours, after which it should be removed.  Wrap patch in a tissue and discard in the trash. Wash hands thoroughly with soap and water. 2. You may remove the patch earlier than 72 hours if you experience unpleasant side effects which may include dry mouth, dizziness or visual disturbances. 3. Avoid touching the patch. Wash your hands with soap and water after contact with the patch.     Regional Anesthesia Blocks  1. Numbness or the inability to move the "blocked" extremity may last from 3-48 hours after  placement. The length of time depends on the medication injected and your individual response to the medication. If the numbness is not going away after 48 hours, call your surgeon.  2. The extremity that is blocked will need to be protected until the numbness is gone and the  Strength has returned. Because you cannot feel it, you will need to take extra care to avoid injury. Because it may be weak, you may have difficulty moving it or using it. You may not know what position it is in without looking at it while the block is in effect.  3. For blocks in the legs and feet, returning to weight bearing and walking needs to be done carefully. You will need to wait until the numbness is entirely gone and the strength has returned. You should be able to move your leg and foot normally before you try and bear weight or walk. You will need someone to be with you when you first try to ensure you do not fall and possibly risk injury.  4. Bruising and tenderness at the needle site are common side effects and will resolve in a few days.  5. Persistent numbness or new problems with movement should be communicated to the surgeon or the [place] [phone] [place] [phone]).

## 2015-08-17 NOTE — Anesthesia Procedure Notes (Signed)
Anesthesia Regional Block:  Pectoralis block  Pre-Anesthetic Checklist: ,, timeout performed, Correct Patient, Correct Site, Correct Laterality, Correct Procedure, Correct Position, site marked, Risks and benefits discussed,  Surgical consent,  Pre-op evaluation,  At surgeon's request and post-op pain management  Laterality: Left  Prep: chloraprep       Needles:  Injection technique: Single-shot  Needle Type: Echogenic Needle     Needle Length: 10cm 10 cm Needle Gauge: 21 and 21 G    Additional Needles:  Procedures: ultrasound guided (picture in chart) Pectoralis block Narrative:  Start time: 08/17/2015 10:15 AM Injection made incrementally with aspirations every 5 mL.  Performed by: Personally  Anesthesiologist: Franne Grip  Additional Notes: Tolerated well. Subserratus and PEC 1 block

## 2015-08-17 NOTE — Anesthesia Procedure Notes (Signed)
Procedure Name: LMA Insertion Date/Time: 08/17/2015 12:23 PM Performed by: Toula Moos L Pre-anesthesia Checklist: Patient identified, Emergency Drugs available, Suction available, Patient being monitored and Timeout performed Patient Re-evaluated:Patient Re-evaluated prior to inductionOxygen Delivery Method: Circle system utilized Preoxygenation: Pre-oxygenation with 100% oxygen Intubation Type: IV induction Ventilation: Mask ventilation without difficulty LMA: LMA inserted LMA Size: 4.0 Number of attempts: 1 Airway Equipment and Method: Bite block Placement Confirmation: positive ETCO2 Tube secured with: Tape Dental Injury: Teeth and Oropharynx as per pre-operative assessment

## 2015-08-17 NOTE — Anesthesia Postprocedure Evaluation (Signed)
Anesthesia Post Note  Patient: [name]  Procedure(s) Performed: Procedure(s) (LRB): BRACKETED RADIOACTIVE SEED GUIDED LEFT BREAST LUMPECTOMY WITH AXILLARY SENTINEL LYMPH NODE BIOPSY (Left)  Patient location during evaluation: PACU Anesthesia Type: General and Regional Level of consciousness: awake and alert Pain management: pain level controlled Vital Signs Assessment: post-procedure vital signs reviewed and stable Respiratory status: spontaneous breathing, nonlabored ventilation, respiratory function stable and patient connected to nasal cannula oxygen Cardiovascular status: blood pressure returned to baseline and stable Postop Assessment: no signs of nausea or vomiting Anesthetic complications: no    Last Vitals:  Vitals:   08/17/15 1345 08/17/15 1415  BP: 122/71 124/65  Pulse: 85 68  Resp: (!) 21 16  Temp:  36.4 C    Last Pain:  Vitals:   08/17/15 1415  TempSrc:   PainSc: 2                  Brandee Markin J

## 2015-08-17 NOTE — Progress Notes (Signed)
Assisted Dr. Denenny with left, ultrasound guided, pectoralis block. Side rails up, monitors on throughout procedure. See vital signs in flow sheet. Tolerated Procedure well. 

## 2015-08-19 ENCOUNTER — Encounter (HOSPITAL_BASED_OUTPATIENT_CLINIC_OR_DEPARTMENT_OTHER): Payer: Self-pay | Admitting: General Surgery

## 2015-08-20 MED FILL — SYNTHROID 88 MCG TABLET: 88 | 30 days supply | Qty: 30 | Fill #1

## 2015-08-20 MED FILL — DESVENLAFAXINE ER 50 MG TAB: 50 | 30 days supply | Qty: 30 | Fill #3

## 2015-08-20 MED FILL — TAMOXIFEN 20 MG TABLET: 20 | 30 days supply | Qty: 30 | Fill #1

## 2015-08-20 MED FILL — DEXILANT DR 60 MG CAPSULE: 60 | 30 days supply | Qty: 30 | Fill #0

## 2015-08-21 ENCOUNTER — Telehealth: Payer: Self-pay | Admitting: *Deleted

## 2015-08-21 NOTE — Telephone Encounter (Signed)
Pt wishes to have xrt in [place]. Message sent for appt/SIM with Dr. Tammi Klippel.

## 2015-08-27 DIAGNOSIS — G43111 Migraine with aura, intractable, with status migrainosus: Secondary | ICD-10-CM | POA: Diagnosis not present

## 2015-08-27 DIAGNOSIS — G43839 Menstrual migraine, intractable, without status migrainosus: Secondary | ICD-10-CM | POA: Diagnosis not present

## 2015-08-27 DIAGNOSIS — G43719 Chronic migraine without aura, intractable, without status migrainosus: Secondary | ICD-10-CM | POA: Diagnosis not present

## 2015-08-27 MED FILL — ZONISAMIDE 100 MG CAPSULE: 100 | 30 days supply | Qty: 60 | Fill #0

## 2015-09-01 ENCOUNTER — Ambulatory Visit: Payer: 59 | Admitting: Family Medicine

## 2015-09-07 ENCOUNTER — Ambulatory Visit: Payer: 59 | Admitting: Internal Medicine

## 2015-09-09 NOTE — Progress Notes (Signed)
  Radiation Oncology         [phone] ________________________________  Name: OTYLIA ORLOFF MRN: [id number]  Date: 09/10/2015  DOB: 28-Oct-1970  SIMULATION AND TREATMENT PLANNING NOTE    ICD-9-CM ICD-10-CM   1. Breast cancer of upper-outer quadrant of left female breast ([place]) 174.4 C50.412     DIAGNOSIS:  45 y.o. 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  Identity was confirmed.  All relevant records and images related to the planned course of therapy were reviewed.  The patient freely provided informed written consent to proceed with treatment after reviewing the details related to the planned course of therapy. The consent form was witnessed and verified by the simulation staff.  Then, the patient was set-up in a stable reproducible  supine position for radiation therapy.  CT images were obtained.  Surface markings were placed.  The CT images were loaded into the planning software.  Then the target and avoidance structures were contoured.  Treatment planning then occurred.  The radiation prescription was entered and confirmed.  Then, I designed and supervised the construction of a total of 1 medically necessary complex postioning treatment device.  I have requested : 3D Simulation  I have requested a DVH of the following structures: heart, left lung, lumpectomy cavity.  Special treatment procedure was performed today due to the extra time and effort required by myself to plan and prepare this patient for deep inspiration breath hold technique.  I have determined cardiac sparing to be of benefit to this patient to prevent long term cardiac damage due to radiation of the heart.  Bellows were placed on the patient's abdomen. To facilitate cardiac sparing, the patient was coached by the radiation therapists on breath hold techniques and  breathing practice was performed. Practice waveforms were obtained. The patient was then scanned while maintaining breath hold in the treatment position.  This image was then transferred over to the imaging specialist. The imaging specialist then created a fusion of the free breathing and breath hold scans using the chest wall as the stable structure. I personally reviewed the fusion in axial, coronal and sagittal image planes.  Excellent cardiac sparing was obtained.  I felt the patient is an appropriate candidate for breath hold and the patient will be treated as such.  The image fusion was then reviewed with the patient to reinforce the necessity of reproducible breath hold.  PLAN:  The patient will receive 50 Gy in 25 fractions, then boost lumpectomy site to 60 Gy.  ________________________________  Sheral Apley. Tammi Klippel, M.D.

## 2015-09-10 ENCOUNTER — Ambulatory Visit
Admission: RE | Admit: 2015-09-10 | Discharge: 2015-09-10 | Disposition: A | Discharge status: Home / Self Care | Payer: 59 | Source: Ambulatory Visit | Attending: Radiation Oncology | Admitting: Radiation Oncology

## 2015-09-10 DIAGNOSIS — C50412 Malignant neoplasm of upper-outer quadrant of left female breast: Secondary | ICD-10-CM

## 2015-09-10 DIAGNOSIS — Z510 Encounter for antineoplastic radiation therapy: Secondary | ICD-10-CM | POA: Diagnosis not present

## 2015-09-17 ENCOUNTER — Ambulatory Visit
Admission: RE | Admit: 2015-09-17 | Discharge: 2015-09-17 | Disposition: A | Discharge status: Home / Self Care | Payer: 59 | Source: Ambulatory Visit | Attending: Radiation Oncology | Admitting: Radiation Oncology

## 2015-09-17 DIAGNOSIS — C50412 Malignant neoplasm of upper-outer quadrant of left female breast: Secondary | ICD-10-CM | POA: Diagnosis not present

## 2015-09-17 DIAGNOSIS — Z510 Encounter for antineoplastic radiation therapy: Secondary | ICD-10-CM | POA: Diagnosis not present

## 2015-09-17 MED ORDER — RADIAPLEXRX EX GEL
Freq: Once | CUTANEOUS | Status: AC
  Administered 2015-09-17: 11:00:00 via TOPICAL

## 2015-09-17 MED ORDER — ALRA NON-METALLIC DEODORANT (RAD-ONC)
1.0000 "application " | Freq: Once | TOPICAL | Status: AC
  Administered 2015-09-17: 1 via TOPICAL

## 2015-09-17 MED ORDER — RADIAPLEXRX EX GEL: Freq: Once | CUTANEOUS | Status: AC

## 2015-09-17 MED FILL — SYNTHROID 88 MCG TABLET: 88 | 30 days supply | Qty: 30 | Fill #2

## 2015-09-17 MED FILL — DEXILANT DR 60 MG CAPSULE: 60 | 30 days supply | Qty: 30 | Fill #1

## 2015-09-17 NOTE — Progress Notes (Addendum)
RADIATION THERAPY AND YOU booklet provided to patient. Also, radiaplex and alra provided to patient. Business card provided to patient with my contact information. Will review directions for use of products and potential side effects associated with treatment next week.

## 2015-09-21 ENCOUNTER — Encounter: Payer: Self-pay | Admitting: Radiation Oncology

## 2015-09-21 ENCOUNTER — Ambulatory Visit
Admission: RE | Admit: 2015-09-21 | Discharge: 2015-09-21 | Disposition: A | Discharge status: Home / Self Care | Payer: 59 | Source: Ambulatory Visit | Attending: Radiation Oncology | Admitting: Radiation Oncology

## 2015-09-21 DIAGNOSIS — C50412 Malignant neoplasm of upper-outer quadrant of left female breast: Secondary | ICD-10-CM | POA: Diagnosis not present

## 2015-09-21 DIAGNOSIS — Z510 Encounter for antineoplastic radiation therapy: Secondary | ICD-10-CM | POA: Diagnosis not present

## 2015-09-21 NOTE — Progress Notes (Signed)
PAPERWORK ([employer]) RECEIVED 9/11, GIVEN TO NURSE 9/12

## 2015-09-22 ENCOUNTER — Ambulatory Visit
Admission: RE | Admit: 2015-09-22 | Discharge: 2015-09-22 | Disposition: A | Discharge status: Home / Self Care | Payer: 59 | Source: Ambulatory Visit | Attending: Radiation Oncology | Admitting: Radiation Oncology

## 2015-09-22 ENCOUNTER — Telehealth: Payer: Self-pay | Admitting: Radiation Oncology

## 2015-09-22 DIAGNOSIS — Z510 Encounter for antineoplastic radiation therapy: Secondary | ICD-10-CM | POA: Diagnosis not present

## 2015-09-22 DIAGNOSIS — C50412 Malignant neoplasm of upper-outer quadrant of left female breast: Secondary | ICD-10-CM | POA: Diagnosis not present

## 2015-09-22 NOTE — Telephone Encounter (Signed)
Placed orange folder with complete [employer] paper in Alison's inbox to sign.

## 2015-09-23 ENCOUNTER — Ambulatory Visit
Admission: RE | Admit: 2015-09-23 | Discharge: 2015-09-23 | Disposition: A | Discharge status: Home / Self Care | Payer: 59 | Source: Ambulatory Visit | Attending: Radiation Oncology | Admitting: Radiation Oncology

## 2015-09-23 DIAGNOSIS — C50412 Malignant neoplasm of upper-outer quadrant of left female breast: Secondary | ICD-10-CM | POA: Diagnosis not present

## 2015-09-23 DIAGNOSIS — Z510 Encounter for antineoplastic radiation therapy: Secondary | ICD-10-CM | POA: Diagnosis not present

## 2015-09-24 ENCOUNTER — Encounter: Payer: Self-pay | Admitting: Radiation Oncology

## 2015-09-24 ENCOUNTER — Ambulatory Visit
Admission: RE | Admit: 2015-09-24 | Discharge: 2015-09-24 | Disposition: A | Discharge status: Home / Self Care | Payer: 59 | Source: Ambulatory Visit | Attending: Radiation Oncology | Admitting: Radiation Oncology

## 2015-09-24 DIAGNOSIS — C50412 Malignant neoplasm of upper-outer quadrant of left female breast: Secondary | ICD-10-CM | POA: Diagnosis not present

## 2015-09-24 DIAGNOSIS — Z510 Encounter for antineoplastic radiation therapy: Secondary | ICD-10-CM | POA: Diagnosis not present

## 2015-09-24 NOTE — Progress Notes (Signed)
Paperwork received from doctor, faxed to [facility] @ [phone], conf received 9/14, copy given to patient 9/15

## 2015-09-25 ENCOUNTER — Ambulatory Visit
Admission: RE | Admit: 2015-09-25 | Discharge: 2015-09-25 | Disposition: A | Discharge status: Home / Self Care | Payer: 59 | Source: Ambulatory Visit | Attending: Radiation Oncology | Admitting: Radiation Oncology

## 2015-09-25 DIAGNOSIS — Z510 Encounter for antineoplastic radiation therapy: Secondary | ICD-10-CM | POA: Diagnosis not present

## 2015-09-25 DIAGNOSIS — C50412 Malignant neoplasm of upper-outer quadrant of left female breast: Secondary | ICD-10-CM

## 2015-09-25 NOTE — Progress Notes (Signed)
  Radiation Oncology         [phone]   Name: [name] MRN: [id number]   Date: 09/25/2015  DOB: 1970-12-20   Weekly Radiation Therapy Management    ICD-9-CM ICD-10-CM   1. Breast cancer of upper-outer quadrant of left female breast ([place]) 174.4 C50.412     Current Dose: 10 Gy  Planned Dose:  60 Gy  Narrative The patient presents for routine under treatment assessment. Patient is doing well and not experiencing any skin changes at this point. She has met with nursing and has gone through educational discussion and skin care. Set-up films were reviewed. The chart was checked.  Physical Findings   In general this is a well appearing Caucasian female in no acute distress. She's alert and oriented x4 and appropriate throughout the examination. Cardiopulmonary assessment is negative for acute distress and she exhibits normal effort. Breast exam deferred until next week.   Impression The patient is tolerating radiation.  Plan Continue treatment as planned.     Sheral Apley Tammi Klippel, M.D.  This document serves as a record of services personally performed by Shona Simpson, PA-C and Tyler Pita, MD. It was created on their behalf by Darcus Austin, a trained medical scribe. The creation of this record is based on the scribe's personal observations and the providers' statements to them. This document has been checked and approved by the attending provider.

## 2015-09-28 ENCOUNTER — Ambulatory Visit
Admission: RE | Admit: 2015-09-28 | Discharge: 2015-09-28 | Disposition: A | Discharge status: Home / Self Care | Payer: 59 | Source: Ambulatory Visit | Attending: Radiation Oncology | Admitting: Radiation Oncology

## 2015-09-28 DIAGNOSIS — C50412 Malignant neoplasm of upper-outer quadrant of left female breast: Secondary | ICD-10-CM | POA: Diagnosis not present

## 2015-09-28 DIAGNOSIS — Z510 Encounter for antineoplastic radiation therapy: Secondary | ICD-10-CM | POA: Diagnosis not present

## 2015-09-29 ENCOUNTER — Ambulatory Visit
Admission: RE | Admit: 2015-09-29 | Discharge: 2015-09-29 | Disposition: A | Discharge status: Home / Self Care | Payer: 59 | Source: Ambulatory Visit | Attending: Radiation Oncology | Admitting: Radiation Oncology

## 2015-09-29 DIAGNOSIS — Z510 Encounter for antineoplastic radiation therapy: Secondary | ICD-10-CM | POA: Diagnosis not present

## 2015-09-29 DIAGNOSIS — C50412 Malignant neoplasm of upper-outer quadrant of left female breast: Secondary | ICD-10-CM | POA: Diagnosis not present

## 2015-09-30 ENCOUNTER — Encounter: Payer: Self-pay | Admitting: Radiation Oncology

## 2015-09-30 ENCOUNTER — Ambulatory Visit
Admission: RE | Admit: 2015-09-30 | Discharge: 2015-09-30 | Disposition: A | Discharge status: Home / Self Care | Payer: 59 | Source: Ambulatory Visit | Attending: Radiation Oncology | Admitting: Radiation Oncology

## 2015-09-30 DIAGNOSIS — Z510 Encounter for antineoplastic radiation therapy: Secondary | ICD-10-CM | POA: Diagnosis not present

## 2015-09-30 DIAGNOSIS — C50412 Malignant neoplasm of upper-outer quadrant of left female breast: Secondary | ICD-10-CM | POA: Diagnosis not present

## 2015-09-30 MED ORDER — RADIAPLEXRX EX GEL
Freq: Once | CUTANEOUS | Status: AC
  Administered 2015-09-30: 09:00:00 via TOPICAL

## 2015-09-30 NOTE — Progress Notes (Signed)
PAPERWORK ([employer]) RECEIVED, GIVEN TO NURSE, RETURNED, FAXED CONF REC 9/20, COPY GIVEN TO PATIENT

## 2015-10-01 ENCOUNTER — Ambulatory Visit
Admission: RE | Admit: 2015-10-01 | Discharge: 2015-10-01 | Disposition: A | Discharge status: Home / Self Care | Payer: 59 | Source: Ambulatory Visit | Attending: Radiation Oncology | Admitting: Radiation Oncology

## 2015-10-01 DIAGNOSIS — Z510 Encounter for antineoplastic radiation therapy: Secondary | ICD-10-CM | POA: Diagnosis not present

## 2015-10-01 DIAGNOSIS — C50412 Malignant neoplasm of upper-outer quadrant of left female breast: Secondary | ICD-10-CM | POA: Diagnosis not present

## 2015-10-02 ENCOUNTER — Ambulatory Visit
Admission: RE | Admit: 2015-10-02 | Discharge: 2015-10-02 | Disposition: A | Discharge status: Home / Self Care | Payer: 59 | Source: Ambulatory Visit | Attending: Radiation Oncology | Admitting: Radiation Oncology

## 2015-10-02 VITALS — BP 116/71 | HR 68 | Resp 16 | Wt 111.2 lb

## 2015-10-02 DIAGNOSIS — C50412 Malignant neoplasm of upper-outer quadrant of left female breast: Secondary | ICD-10-CM | POA: Diagnosis not present

## 2015-10-02 DIAGNOSIS — Z510 Encounter for antineoplastic radiation therapy: Secondary | ICD-10-CM | POA: Diagnosis not present

## 2015-10-02 NOTE — Progress Notes (Signed)
Department of Radiation Oncology  Phone:  [phone] Fax:        [phone]  Weekly Treatment Note    Name: [name] Date: 10/03/2015 MRN: [id number] DOB: 12-May-1970   Diagnosis:     ICD-9-CM ICD-10-CM   1. Breast cancer of upper-outer quadrant of left female breast ([name]) 174.4 C50.412      Current dose: 20 Gy  Current fraction: 10   MEDICATIONS: Current Outpatient Prescriptions  Medication Sig Dispense Refill  . Calcium Carb-Cholecalciferol (CALCIUM 1000 + D) 1000-800 MG-UNIT TABS Take by mouth.    . clonazePAM (KLONOPIN) 0.5 MG tablet Take 1 tablet (0.5 mg total) by mouth 2 (two) times daily as needed for anxiety (and sleep). 60 tablet 3  . desvenlafaxine (PRISTIQ) 50 MG 24 hr tablet Take 1 tablet (50 mg total) by mouth daily. 30 tablet 3  . dexlansoprazole (DEXILANT) 60 MG capsule Take 1 capsule (60 mg total) by mouth daily. 30 capsule 3  . non-metallic deodorant (ALRA) MISC Apply 1 application topically daily as needed.    Marland Kitchen oxyCODONE (OXY IR/ROXICODONE) 5 MG immediate release tablet Take 1 tablet (5 mg total) by mouth every 6 (six) hours as needed for moderate pain, severe pain or breakthrough pain. 15 tablet 0  . SYNTHROID 88 MCG tablet Take 1 tablet (88 mcg total) by mouth daily before breakfast. 30 tablet 11  . tamoxifen (NOLVADEX) 20 MG tablet Take 1 tablet (20 mg total) by mouth daily. 30 tablet 1  . Wound Cleansers (RADIAPLEX EX) Apply topically.    Marland Kitchen zonisamide (ZONEGRAN) 50 MG capsule Take 4 capsules (200 mg total) by mouth daily. 60 capsule 1   Current Facility-Administered Medications  Medication Dose Route Frequency Provider Last Rate Last Dose  . hyaluronate sodium (RADIAPLEXRX) gel   Topical Once Tyler Pita, MD       Facility-Administered Medications Ordered in Other Encounters  Medication Dose Route Frequency Provider Last Rate Last Dose  . ferric gluconate (NULECIT) 125 mg in sodium chloride 0.9 % 100 mL IVPB  125 mg Intravenous Once  Baird Cancer, PA-C         ALLERGIES: Review of patient's allergies indicates no known allergies.   LABORATORY DATA:  Lab Results  Component Value Date   WBC 3.6 (L) 04/09/2014   HGB 13.7 04/09/2014   HCT 42.4 04/09/2014   MCV 96.4 04/09/2014   PLT 183 04/09/2014   Lab Results  Component Value Date   NA 138 04/09/2014   K 4.1 04/09/2014   CL 104 04/09/2014   CO2 27 04/09/2014   Lab Results  Component Value Date   ALT 11 04/09/2014   AST 18 04/09/2014   ALKPHOS 42 04/09/2014   BILITOT 0.5 04/09/2014     NARRATIVE: [name] was seen today for weekly treatment management. The chart was checked and the patient's films were reviewed.  The patient states that she is doing very well this week. She denies any major issues in terms of skin irritation currently.  PHYSICAL EXAMINATION: weight is 111 lb 3.2 oz (50.4 kg). Her blood pressure is 116/71 and her pulse is 68. Her respiration is 16 and oxygen saturation is 100%.        ASSESSMENT: The patient is doing satisfactorily with treatment.  PLAN: We will continue with the patient's radiation treatment as planned.        This document serves as a record of services personally performed by Kyung Rudd, MD. It was created on  his behalf by Bethann Humble, a trained medical scribe. The creation of this record is based on the scribe's personal observations and the provider's statements to them. This document has been checked and approved by the attending provider.

## 2015-10-05 ENCOUNTER — Ambulatory Visit
Admission: RE | Admit: 2015-10-05 | Discharge: 2015-10-05 | Disposition: A | Discharge status: Home / Self Care | Payer: 59 | Source: Ambulatory Visit | Attending: Radiation Oncology | Admitting: Radiation Oncology

## 2015-10-05 DIAGNOSIS — C50412 Malignant neoplasm of upper-outer quadrant of left female breast: Secondary | ICD-10-CM | POA: Diagnosis not present

## 2015-10-05 DIAGNOSIS — Z510 Encounter for antineoplastic radiation therapy: Secondary | ICD-10-CM | POA: Diagnosis not present

## 2015-10-05 MED FILL — ZONISAMIDE 100 MG CAPSULE: 100 | 30 days supply | Qty: 60 | Fill #1

## 2015-10-06 ENCOUNTER — Ambulatory Visit
Admission: RE | Admit: 2015-10-06 | Discharge: 2015-10-06 | Disposition: A | Discharge status: Home / Self Care | Payer: 59 | Source: Ambulatory Visit | Attending: Radiation Oncology | Admitting: Radiation Oncology

## 2015-10-06 DIAGNOSIS — C50412 Malignant neoplasm of upper-outer quadrant of left female breast: Secondary | ICD-10-CM | POA: Diagnosis not present

## 2015-10-06 DIAGNOSIS — Z510 Encounter for antineoplastic radiation therapy: Secondary | ICD-10-CM | POA: Diagnosis not present

## 2015-10-07 ENCOUNTER — Ambulatory Visit
Admission: RE | Admit: 2015-10-07 | Discharge: 2015-10-07 | Disposition: A | Discharge status: Home / Self Care | Payer: 59 | Source: Ambulatory Visit | Attending: Radiation Oncology | Admitting: Radiation Oncology

## 2015-10-07 DIAGNOSIS — Z510 Encounter for antineoplastic radiation therapy: Secondary | ICD-10-CM | POA: Diagnosis not present

## 2015-10-07 DIAGNOSIS — C50412 Malignant neoplasm of upper-outer quadrant of left female breast: Secondary | ICD-10-CM | POA: Diagnosis not present

## 2015-10-08 ENCOUNTER — Ambulatory Visit
Admission: RE | Admit: 2015-10-08 | Discharge: 2015-10-08 | Disposition: A | Discharge status: Home / Self Care | Payer: 59 | Source: Ambulatory Visit | Attending: Radiation Oncology | Admitting: Radiation Oncology

## 2015-10-08 ENCOUNTER — Other Ambulatory Visit (HOSPITAL_COMMUNITY): Payer: Self-pay | Admitting: Oncology

## 2015-10-08 ENCOUNTER — Inpatient Hospital Stay
Admission: RE | Admit: 2015-10-08 | Discharge: 2015-10-08 | Disposition: A | Discharge status: Home / Self Care | Payer: Self-pay | Source: Ambulatory Visit | Attending: Radiation Oncology | Admitting: Radiation Oncology

## 2015-10-08 DIAGNOSIS — R110 Nausea: Secondary | ICD-10-CM

## 2015-10-08 DIAGNOSIS — C50412 Malignant neoplasm of upper-outer quadrant of left female breast: Secondary | ICD-10-CM | POA: Diagnosis not present

## 2015-10-08 DIAGNOSIS — Z510 Encounter for antineoplastic radiation therapy: Secondary | ICD-10-CM | POA: Diagnosis not present

## 2015-10-08 MED ORDER — ONDANSETRON HCL 4 MG PO TABS: 8.0000 mg | ORAL_TABLET | Freq: Once | ORAL | Status: AC

## 2015-10-08 MED ORDER — ONDANSETRON HCL 4 MG PO TABS: 4.0000 mg | ORAL_TABLET | Freq: Three times a day (TID) | ORAL | 1 refills | Status: AC | PRN

## 2015-10-08 MED FILL — ONDANSETRON HCL 4 MG TABLET: 4 | 4 days supply | Qty: 20 | Fill #0

## 2015-10-09 ENCOUNTER — Encounter: Payer: Self-pay | Admitting: Radiation Oncology

## 2015-10-09 ENCOUNTER — Ambulatory Visit
Admission: RE | Admit: 2015-10-09 | Discharge: 2015-10-09 | Disposition: A | Discharge status: Home / Self Care | Payer: 59 | Source: Ambulatory Visit | Attending: Radiation Oncology | Admitting: Radiation Oncology

## 2015-10-09 VITALS — BP 119/69 | HR 77 | Resp 16 | Wt 112.2 lb

## 2015-10-09 DIAGNOSIS — Z510 Encounter for antineoplastic radiation therapy: Secondary | ICD-10-CM | POA: Diagnosis not present

## 2015-10-09 DIAGNOSIS — C50412 Malignant neoplasm of upper-outer quadrant of left female breast: Secondary | ICD-10-CM | POA: Diagnosis not present

## 2015-10-09 NOTE — Progress Notes (Signed)
Weight and vitals stable. Reports diffuse left breast pain. Reports taking Aleve for relief. Therapist confirm there are no skin changes within the treatment field. No evidence of lymphedema noted. Reports onset of moderate fatigue.   BP 119/69 (BP Location: Right Arm, Patient Position: Sitting, Cuff Size: Normal)   Pulse 77   Resp 16   Wt 112 lb 3.2 oz (50.9 kg)   SpO2 100%   BMI 19.88 kg/m  Wt Readings from Last 3 Encounters:  10/09/15 112 lb 3.2 oz (50.9 kg)  10/02/15 111 lb 3.2 oz (50.4 kg)  08/17/15 113 lb 6.4 oz (51.4 kg)

## 2015-10-09 NOTE — Progress Notes (Signed)
Department of Radiation Oncology  Phone:  [phone] Fax:        [phone]  Weekly Treatment Note    Name: [name] Date: 10/09/2015 MRN: [id number] DOB: 10-Apr-1970   Diagnosis:     ICD-9-CM ICD-10-CM   1. Breast cancer of upper-outer quadrant of left female breast ([place]) 174.4 C50.412      Current dose: 30 Gy  Current fraction: 15   MEDICATIONS: Current Outpatient Prescriptions  Medication Sig Dispense Refill  . Calcium Carb-Cholecalciferol (CALCIUM 1000 + D) 1000-800 MG-UNIT TABS Take by mouth.    . clonazePAM (KLONOPIN) 0.5 MG tablet Take 1 tablet (0.5 mg total) by mouth 2 (two) times daily as needed for anxiety (and sleep). 60 tablet 3  . desvenlafaxine (PRISTIQ) 50 MG 24 hr tablet Take 1 tablet (50 mg total) by mouth daily. 30 tablet 3  . dexlansoprazole (DEXILANT) 60 MG capsule Take 1 capsule (60 mg total) by mouth daily. 30 capsule 3  . non-metallic deodorant (ALRA) MISC Apply 1 application topically daily as needed.    . ondansetron (ZOFRAN) 4 MG tablet Take 1-2 tablets (4-8 mg total) by mouth every 8 (eight) hours as needed for nausea or vomiting. 20 tablet 1  . oxyCODONE (OXY IR/ROXICODONE) 5 MG immediate release tablet Take 1 tablet (5 mg total) by mouth every 6 (six) hours as needed for moderate pain, severe pain or breakthrough pain. 15 tablet 0  . SYNTHROID 88 MCG tablet Take 1 tablet (88 mcg total) by mouth daily before breakfast. 30 tablet 11  . tamoxifen (NOLVADEX) 20 MG tablet Take 1 tablet (20 mg total) by mouth daily. 30 tablet 1  . Wound Cleansers (RADIAPLEX EX) Apply topically.    Marland Kitchen zonisamide (ZONEGRAN) 50 MG capsule Take 4 capsules (200 mg total) by mouth daily. 60 capsule 1   Current Facility-Administered Medications  Medication Dose Route Frequency Provider Last Rate Last Dose  . hyaluronate sodium (RADIAPLEXRX) gel   Topical Once Tyler Pita, MD       Facility-Administered Medications Ordered in Other Encounters  Medication Dose  Route Frequency Provider Last Rate Last Dose  . ferric gluconate (NULECIT) 125 mg in sodium chloride 0.9 % 100 mL IVPB  125 mg Intravenous Once Thomas S Kefalas, PA-C      . ondansetron [facility]) tablet 8 mg  8 mg Oral Once Baird Cancer, PA-C         ALLERGIES: Review of patient's allergies indicates no known allergies.   LABORATORY DATA:  Lab Results  Component Value Date   WBC 3.6 (L) 04/09/2014   HGB 13.7 04/09/2014   HCT 42.4 04/09/2014   MCV 96.4 04/09/2014   PLT 183 04/09/2014   Lab Results  Component Value Date   NA 138 04/09/2014   K 4.1 04/09/2014   CL 104 04/09/2014   CO2 27 04/09/2014   Lab Results  Component Value Date   ALT 11 04/09/2014   AST 18 04/09/2014   ALKPHOS 42 04/09/2014   BILITOT 0.5 04/09/2014     NARRATIVE: [name] was seen today for weekly treatment management. The chart was checked and the patient's films were reviewed.  Weight and vitals stable. Reports diffuse left breast pain. Reports taking Aleve for relief. Therapist confirm there are no skin changes within the treatment field. No evidence of lymphedema noted. Reports onset of moderate fatigue.   BP 119/69 (BP Location: Right Arm, Patient Position: Sitting, Cuff Size: Normal)   Pulse 77   Resp  16   Wt 112 lb 3.2 oz (50.9 kg)   SpO2 100%   BMI 19.88 kg/m  Wt Readings from Last 3 Encounters:  10/09/15 112 lb 3.2 oz (50.9 kg)  10/02/15 111 lb 3.2 oz (50.4 kg)  08/17/15 113 lb 6.4 oz (51.4 kg)     PHYSICAL EXAMINATION: weight is 112 lb 3.2 oz (50.9 kg). Her blood pressure is 119/69 and her pulse is 77. Her respiration is 16 and oxygen saturation is 100%.        ASSESSMENT: The patient is doing satisfactorily with treatment.  PLAN: We will continue with the patient's radiation treatment as planned.

## 2015-10-12 ENCOUNTER — Ambulatory Visit
Admission: RE | Admit: 2015-10-12 | Discharge: 2015-10-12 | Disposition: A | Discharge status: Home / Self Care | Payer: 59 | Source: Ambulatory Visit | Attending: Radiation Oncology | Admitting: Radiation Oncology

## 2015-10-12 DIAGNOSIS — G43709 Chronic migraine without aura, not intractable, without status migrainosus: Secondary | ICD-10-CM | POA: Diagnosis not present

## 2015-10-12 DIAGNOSIS — C50412 Malignant neoplasm of upper-outer quadrant of left female breast: Secondary | ICD-10-CM | POA: Diagnosis not present

## 2015-10-12 DIAGNOSIS — C50912 Malignant neoplasm of unspecified site of left female breast: Secondary | ICD-10-CM | POA: Diagnosis not present

## 2015-10-12 DIAGNOSIS — K219 Gastro-esophageal reflux disease without esophagitis: Secondary | ICD-10-CM | POA: Diagnosis not present

## 2015-10-12 DIAGNOSIS — Z8639 Personal history of other endocrine, nutritional and metabolic disease: Secondary | ICD-10-CM | POA: Diagnosis not present

## 2015-10-12 DIAGNOSIS — E039 Hypothyroidism, unspecified: Secondary | ICD-10-CM | POA: Diagnosis not present

## 2015-10-12 DIAGNOSIS — Z170 Estrogen receptor positive status [ER+]: Secondary | ICD-10-CM | POA: Diagnosis not present

## 2015-10-12 DIAGNOSIS — F5101 Primary insomnia: Secondary | ICD-10-CM | POA: Diagnosis not present

## 2015-10-12 DIAGNOSIS — Z510 Encounter for antineoplastic radiation therapy: Secondary | ICD-10-CM | POA: Diagnosis not present

## 2015-10-13 ENCOUNTER — Telehealth: Payer: Self-pay | Admitting: Radiation Oncology

## 2015-10-13 ENCOUNTER — Ambulatory Visit
Admission: RE | Admit: 2015-10-13 | Discharge: 2015-10-13 | Disposition: A | Discharge status: Home / Self Care | Payer: 59 | Source: Ambulatory Visit | Attending: Radiation Oncology | Admitting: Radiation Oncology

## 2015-10-13 DIAGNOSIS — Z510 Encounter for antineoplastic radiation therapy: Secondary | ICD-10-CM | POA: Diagnosis not present

## 2015-10-13 DIAGNOSIS — C50412 Malignant neoplasm of upper-outer quadrant of left female breast: Secondary | ICD-10-CM | POA: Diagnosis not present

## 2015-10-13 MED ORDER — RADIAPLEXRX EX GEL
Freq: Once | CUTANEOUS | Status: AC
  Administered 2015-10-13: 15:00:00 via TOPICAL

## 2015-10-13 NOTE — Telephone Encounter (Signed)
Faxed third copy of complete and signed [employer] forms to [employer]. Fax confirmation of delivery obtained.

## 2015-10-14 ENCOUNTER — Ambulatory Visit
Admission: RE | Admit: 2015-10-14 | Discharge: 2015-10-14 | Disposition: A | Discharge status: Home / Self Care | Payer: 59 | Source: Ambulatory Visit | Attending: Radiation Oncology | Admitting: Radiation Oncology

## 2015-10-14 DIAGNOSIS — C50412 Malignant neoplasm of upper-outer quadrant of left female breast: Secondary | ICD-10-CM | POA: Diagnosis not present

## 2015-10-14 DIAGNOSIS — Z510 Encounter for antineoplastic radiation therapy: Secondary | ICD-10-CM | POA: Diagnosis not present

## 2015-10-15 ENCOUNTER — Ambulatory Visit
Admission: RE | Admit: 2015-10-15 | Discharge: 2015-10-15 | Disposition: A | Discharge status: Home / Self Care | Payer: 59 | Source: Ambulatory Visit | Attending: Radiation Oncology | Admitting: Radiation Oncology

## 2015-10-15 ENCOUNTER — Other Ambulatory Visit (HOSPITAL_COMMUNITY): Payer: Self-pay | Admitting: Oncology

## 2015-10-15 DIAGNOSIS — R232 Flushing: Secondary | ICD-10-CM

## 2015-10-15 DIAGNOSIS — C50412 Malignant neoplasm of upper-outer quadrant of left female breast: Secondary | ICD-10-CM | POA: Diagnosis not present

## 2015-10-15 DIAGNOSIS — Z510 Encounter for antineoplastic radiation therapy: Secondary | ICD-10-CM | POA: Diagnosis not present

## 2015-10-15 DIAGNOSIS — T451X5A Adverse effect of antineoplastic and immunosuppressive drugs, initial encounter: Secondary | ICD-10-CM

## 2015-10-15 DIAGNOSIS — G4709 Other insomnia: Secondary | ICD-10-CM

## 2015-10-15 DIAGNOSIS — E039 Hypothyroidism, unspecified: Secondary | ICD-10-CM

## 2015-10-15 DIAGNOSIS — K219 Gastro-esophageal reflux disease without esophagitis: Secondary | ICD-10-CM

## 2015-10-15 MED ORDER — DEXLANSOPRAZOLE 60 MG PO CPDR: 60.0000 mg | DELAYED_RELEASE_CAPSULE | Freq: Every day | ORAL | 3 refills | Status: AC

## 2015-10-15 MED ORDER — SYNTHROID 88 MCG PO TABS: 88.0000 ug | ORAL_TABLET | Freq: Every day | ORAL | 11 refills | Status: AC

## 2015-10-15 MED ORDER — CLONAZEPAM 0.5 MG PO TABS: 0.5000 mg | ORAL_TABLET | Freq: Two times a day (BID) | ORAL | 3 refills | Status: AC | PRN

## 2015-10-15 MED ORDER — DESVENLAFAXINE SUCCINATE ER 50 MG PO TB24: 50.0000 mg | ORAL_TABLET | Freq: Every day | ORAL | 3 refills | Status: AC

## 2015-10-15 MED FILL — DESVENLAFAXINE ER 50 MG TAB: 50 | 90 days supply | Qty: 90 | Fill #0

## 2015-10-15 MED FILL — DEXILANT DR 60 MG CAPSULE: 60 | 30 days supply | Qty: 30 | Fill #0

## 2015-10-15 MED FILL — clonazePAM 0.5 MG TABS: 0.5 | 30 days supply | Qty: 60 | Fill #0

## 2015-10-15 MED FILL — SYNTHROID 88 MCG TABLET: 88 | 30 days supply | Qty: 30 | Fill #0

## 2015-10-16 ENCOUNTER — Ambulatory Visit
Admission: RE | Admit: 2015-10-16 | Discharge: 2015-10-16 | Disposition: A | Discharge status: Home / Self Care | Payer: 59 | Source: Ambulatory Visit | Attending: Radiation Oncology | Admitting: Radiation Oncology

## 2015-10-16 ENCOUNTER — Ambulatory Visit: Payer: 59 | Admitting: Radiation Oncology

## 2015-10-16 VITALS — BP 117/69 | HR 59 | Temp 97.8°F | Resp 18 | Ht 63.0 in | Wt 110.2 lb

## 2015-10-16 DIAGNOSIS — C50412 Malignant neoplasm of upper-outer quadrant of left female breast: Secondary | ICD-10-CM | POA: Diagnosis not present

## 2015-10-16 DIAGNOSIS — Z510 Encounter for antineoplastic radiation therapy: Secondary | ICD-10-CM | POA: Diagnosis not present

## 2015-10-16 NOTE — Progress Notes (Signed)
  Radiation Oncology         [phone]   Name: [name] MRN: [id number]   Date: 10/16/2015  DOB: 02-20-1970   Weekly Radiation Therapy Management    ICD-9-CM ICD-10-CM   1. Malignant neoplasm of upper-outer quadrant of left female breast, unspecified estrogen receptor status (HCC) 174.4 C50.412     Current Dose: 40 Gy  Planned Dose:  60 Gy  Narrative The patient presents for routine under treatment assessment.    Set-up films were reviewed. The chart was checked.  Physical Findings Vitals:   10/16/15 0828  BP: 117/69  Pulse: (!) 59  Resp: 18  Temp: 97.8 F (36.6 C)  TempSrc: Oral  SpO2: 100%  Weight: 110 lb 3.2 oz (50 kg)  Height: 5\' 3"  (1.6 m)   In general this is a well appearing Caucasian female in no acute distress. She's alert and oriented x4 and appropriate throughout the examination. Cardiopulmonary assessment is negative for acute distress and she exhibits normal effort. Breast exam mild erythema.   Impression The patient is tolerating radiation.  Plan Continue treatment as planned.     Sheral Apley Tammi Klippel, M.D.  This document serves as a record of services personally performed by Shona Simpson, PA-C and Tyler Pita, MD. It was created on their behalf by Darcus Austin, a trained medical scribe. The creation of this record is based on the scribe's personal observations and the providers' statements to them. This document has been checked and approved by the attending provider.

## 2015-10-16 NOTE — Progress Notes (Signed)
Weight and vitals stable. Reports no  pain.S skin change erythema to left breast  treatment field. No evidence of lymphedema noted. Reports onset of moderate fatigue.  Wt Readings from Last 3 Encounters:  10/16/15 110 lb 3.2 oz (50 kg)  10/09/15 112 lb 3.2 oz (50.9 kg)  10/02/15 111 lb 3.2 oz (50.4 kg)  BP 117/69 (BP Location: Right Arm, Patient Position: Sitting, Cuff Size: Normal)   Pulse (!) 59   Temp 97.8 F (36.6 C) (Oral)   Resp 18   Ht 5\' 3"  (1.6 m)   Wt 110 lb 3.2 oz (50 kg)   SpO2 100%   BMI 19.52 kg/m

## 2015-10-19 ENCOUNTER — Ambulatory Visit: Payer: 59 | Admitting: Radiation Oncology

## 2015-10-19 ENCOUNTER — Ambulatory Visit
Admission: RE | Admit: 2015-10-19 | Discharge: 2015-10-19 | Disposition: A | Discharge status: Home / Self Care | Payer: 59 | Source: Ambulatory Visit | Attending: Radiation Oncology | Admitting: Radiation Oncology

## 2015-10-19 DIAGNOSIS — C50412 Malignant neoplasm of upper-outer quadrant of left female breast: Secondary | ICD-10-CM | POA: Diagnosis not present

## 2015-10-19 DIAGNOSIS — Z510 Encounter for antineoplastic radiation therapy: Secondary | ICD-10-CM | POA: Diagnosis not present

## 2015-10-20 ENCOUNTER — Ambulatory Visit
Admission: RE | Admit: 2015-10-20 | Discharge: 2015-10-20 | Disposition: A | Discharge status: Home / Self Care | Payer: 59 | Source: Ambulatory Visit | Attending: Radiation Oncology | Admitting: Radiation Oncology

## 2015-10-20 DIAGNOSIS — Z510 Encounter for antineoplastic radiation therapy: Secondary | ICD-10-CM | POA: Diagnosis not present

## 2015-10-20 DIAGNOSIS — C50412 Malignant neoplasm of upper-outer quadrant of left female breast: Secondary | ICD-10-CM

## 2015-10-20 MED ORDER — SONAFINE EX EMUL
1.0000 "application " | Freq: Two times a day (BID) | CUTANEOUS | Status: DC
  Administered 2015-10-20: 1 via TOPICAL

## 2015-10-20 NOTE — Progress Notes (Signed)
Understand from therapist skin within treatment field is slightly hyperpigmented without desquamation. [name], RT reports the patient is requesting "a different cream." Will provide patient with Sonafine. Will reinforce the education that cream doesn't prevent skin changes but, simply moisturizes the skin in an attempt to less intensity of irritation.

## 2015-10-21 ENCOUNTER — Ambulatory Visit
Admission: RE | Admit: 2015-10-21 | Discharge: 2015-10-21 | Disposition: A | Discharge status: Home / Self Care | Payer: 59 | Source: Ambulatory Visit | Attending: Radiation Oncology | Admitting: Radiation Oncology

## 2015-10-21 ENCOUNTER — Other Ambulatory Visit: Payer: Self-pay | Admitting: Hematology and Oncology

## 2015-10-21 DIAGNOSIS — Z510 Encounter for antineoplastic radiation therapy: Secondary | ICD-10-CM | POA: Diagnosis not present

## 2015-10-21 DIAGNOSIS — C50412 Malignant neoplasm of upper-outer quadrant of left female breast: Secondary | ICD-10-CM | POA: Diagnosis not present

## 2015-10-21 MED ORDER — TAMOXIFEN CITRATE 20 MG PO TABS: 20.0000 mg | ORAL_TABLET | Freq: Every day | ORAL | 3 refills | Status: AC

## 2015-10-21 MED FILL — TAMOXIFEN 20 MG TABLET: 20 | 90 days supply | Qty: 90 | Fill #0

## 2015-10-22 ENCOUNTER — Ambulatory Visit
Admission: RE | Admit: 2015-10-22 | Discharge: 2015-10-22 | Disposition: A | Discharge status: Home / Self Care | Payer: 59 | Source: Ambulatory Visit | Attending: Radiation Oncology | Admitting: Radiation Oncology

## 2015-10-22 DIAGNOSIS — C50412 Malignant neoplasm of upper-outer quadrant of left female breast: Secondary | ICD-10-CM

## 2015-10-22 DIAGNOSIS — Z510 Encounter for antineoplastic radiation therapy: Secondary | ICD-10-CM | POA: Diagnosis not present

## 2015-10-22 NOTE — Progress Notes (Signed)
Department of Radiation Oncology  Phone:  [phone] Fax:        [phone]  Weekly Treatment Note    Name: [name] Date: 10/22/2015 MRN: [id number] DOB: [birth year]   Diagnosis:     ICD-9-CM ICD-10-CM   1. Malignant neoplasm of upper-outer quadrant of left female breast, unspecified estrogen receptor status (HCC) 174.4 C50.412      Current dose: 48 Gy  Current fraction: 24   MEDICATIONS: Current Outpatient Prescriptions  Medication Sig Dispense Refill  . Calcium Carb-Cholecalciferol (CALCIUM 1000 + D) 1000-800 MG-UNIT TABS Take by mouth.    . clonazePAM (KLONOPIN) 0.5 MG tablet Take 1 tablet (0.5 mg total) by mouth 2 (two) times daily as needed for anxiety (and sleep). 60 tablet 3  . desvenlafaxine (PRISTIQ) 50 MG 24 hr tablet Take 1 tablet (50 mg total) by mouth daily. 30 tablet 3  . dexlansoprazole (DEXILANT) 60 MG capsule Take 1 capsule (60 mg total) by mouth daily. 30 capsule 3  . non-metallic deodorant (ALRA) MISC Apply 1 application topically daily as needed.    . ondansetron (ZOFRAN) 4 MG tablet Take 1-2 tablets (4-8 mg total) by mouth every 8 (eight) hours as needed for nausea or vomiting. 20 tablet 1  . oxyCODONE (OXY IR/ROXICODONE) 5 MG immediate release tablet Take 1 tablet (5 mg total) by mouth every 6 (six) hours as needed for moderate pain, severe pain or breakthrough pain. 15 tablet 0  . SYNTHROID 88 MCG tablet Take 1 tablet (88 mcg total) by mouth daily before breakfast. 30 tablet 11  . tamoxifen (NOLVADEX) 20 MG tablet Take 1 tablet (20 mg total) by mouth daily. 90 tablet 3  . Wound Cleansers (RADIAPLEX EX) Apply topically.    Marland Kitchen zonisamide (ZONEGRAN) 50 MG capsule Take 4 capsules (200 mg total) by mouth daily. 60 capsule 1   Current Facility-Administered Medications  Medication Dose Route Frequency Provider Last Rate Last Dose  . hyaluronate sodium (RADIAPLEXRX) gel   Topical Once Tyler Pita, MD       Facility-Administered Medications  Ordered in Other Encounters  Medication Dose Route Frequency Provider Last Rate Last Dose  . ferric gluconate (NULECIT) 125 mg in sodium chloride 0.9 % 100 mL IVPB  125 mg Intravenous Once Thomas S Kefalas, PA-C      . ondansetron [facility]) tablet 8 mg  8 mg Oral Once Baird Cancer, PA-C         ALLERGIES: Review of patient's allergies indicates no known allergies.   LABORATORY DATA:  Lab Results  Component Value Date   WBC 3.6 (L) 04/09/2014   HGB 13.7 04/09/2014   HCT 42.4 04/09/2014   MCV 96.4 04/09/2014   PLT 183 04/09/2014   Lab Results  Component Value Date   NA 138 04/09/2014   K 4.1 04/09/2014   CL 104 04/09/2014   CO2 27 04/09/2014   Lab Results  Component Value Date   ALT 11 04/09/2014   AST 18 04/09/2014   ALKPHOS 42 04/09/2014   BILITOT 0.5 04/09/2014     NARRATIVE: [name] was seen today for weekly treatment management. The chart was checked and the patient's films were reviewed.  Understand from therapist skin within treatment field is slightly hyperpigmented without desquamation. [name], RT reports the patient is requesting "a different cream." Will provide patient with Sonafine. Will reinforce the education that cream doesn't prevent skin changes but, simply moisturizes the skin in an attempt to less intensity of irritation.  The patient is nearing her boost treatment and this was set up today. The patient states that her skin is doing fairly well at this time and she does continue to use skin cream on a daily basis. We will switch to a new skin cream currently as noted above which does often help as the patient nears the end of the course of radiation treatment.  PHYSICAL EXAMINATION: vitals were not taken for this visit.     Some hyperpigmentation is present without any desquamation. The boost site looks very good currently.  ASSESSMENT: The patient is doing satisfactorily with treatment.  PLAN: We will continue with the patient's  radiation treatment as planned.

## 2015-10-23 ENCOUNTER — Ambulatory Visit
Admission: RE | Admit: 2015-10-23 | Discharge: 2015-10-23 | Disposition: A | Discharge status: Home / Self Care | Payer: 59 | Source: Ambulatory Visit | Attending: Radiation Oncology | Admitting: Radiation Oncology

## 2015-10-23 DIAGNOSIS — C50412 Malignant neoplasm of upper-outer quadrant of left female breast: Secondary | ICD-10-CM | POA: Diagnosis not present

## 2015-10-23 DIAGNOSIS — Z510 Encounter for antineoplastic radiation therapy: Secondary | ICD-10-CM | POA: Diagnosis not present

## 2015-10-26 ENCOUNTER — Ambulatory Visit
Admission: RE | Admit: 2015-10-26 | Discharge: 2015-10-26 | Disposition: A | Discharge status: Home / Self Care | Payer: 59 | Source: Ambulatory Visit | Attending: Radiation Oncology | Admitting: Radiation Oncology

## 2015-10-26 DIAGNOSIS — Z510 Encounter for antineoplastic radiation therapy: Secondary | ICD-10-CM | POA: Diagnosis not present

## 2015-10-26 DIAGNOSIS — Z170 Estrogen receptor positive status [ER+]: Secondary | ICD-10-CM | POA: Insufficient documentation

## 2015-10-26 DIAGNOSIS — C50412 Malignant neoplasm of upper-outer quadrant of left female breast: Secondary | ICD-10-CM | POA: Insufficient documentation

## 2015-10-26 DIAGNOSIS — Z79810 Long term (current) use of selective estrogen receptor modulators (SERMs): Secondary | ICD-10-CM | POA: Diagnosis not present

## 2015-10-26 DIAGNOSIS — Z79899 Other long term (current) drug therapy: Secondary | ICD-10-CM | POA: Insufficient documentation

## 2015-10-27 ENCOUNTER — Ambulatory Visit
Admission: RE | Admit: 2015-10-27 | Discharge: 2015-10-27 | Disposition: A | Discharge status: Home / Self Care | Payer: 59 | Source: Ambulatory Visit | Attending: Radiation Oncology | Admitting: Radiation Oncology

## 2015-10-27 DIAGNOSIS — Z79899 Other long term (current) drug therapy: Secondary | ICD-10-CM | POA: Diagnosis not present

## 2015-10-27 DIAGNOSIS — Z510 Encounter for antineoplastic radiation therapy: Secondary | ICD-10-CM | POA: Diagnosis not present

## 2015-10-27 DIAGNOSIS — Z170 Estrogen receptor positive status [ER+]: Secondary | ICD-10-CM | POA: Diagnosis not present

## 2015-10-27 DIAGNOSIS — Z79810 Long term (current) use of selective estrogen receptor modulators (SERMs): Secondary | ICD-10-CM | POA: Diagnosis not present

## 2015-10-27 DIAGNOSIS — C50412 Malignant neoplasm of upper-outer quadrant of left female breast: Secondary | ICD-10-CM | POA: Diagnosis not present

## 2015-10-28 ENCOUNTER — Ambulatory Visit
Admission: RE | Admit: 2015-10-28 | Discharge: 2015-10-28 | Disposition: A | Discharge status: Home / Self Care | Payer: 59 | Source: Ambulatory Visit | Attending: Radiation Oncology | Admitting: Radiation Oncology

## 2015-10-28 DIAGNOSIS — Z170 Estrogen receptor positive status [ER+]: Secondary | ICD-10-CM | POA: Diagnosis not present

## 2015-10-28 DIAGNOSIS — Z79810 Long term (current) use of selective estrogen receptor modulators (SERMs): Secondary | ICD-10-CM | POA: Diagnosis not present

## 2015-10-28 DIAGNOSIS — Z510 Encounter for antineoplastic radiation therapy: Secondary | ICD-10-CM | POA: Diagnosis not present

## 2015-10-28 DIAGNOSIS — C50412 Malignant neoplasm of upper-outer quadrant of left female breast: Secondary | ICD-10-CM | POA: Diagnosis not present

## 2015-10-28 DIAGNOSIS — Z79899 Other long term (current) drug therapy: Secondary | ICD-10-CM | POA: Diagnosis not present

## 2015-10-29 ENCOUNTER — Inpatient Hospital Stay: Admission: RE | Admit: 2015-10-29 | Payer: 59 | Source: Ambulatory Visit | Admitting: Radiation Oncology

## 2015-10-29 ENCOUNTER — Ambulatory Visit
Admission: RE | Admit: 2015-10-29 | Discharge: 2015-10-29 | Disposition: A | Discharge status: Home / Self Care | Payer: 59 | Source: Ambulatory Visit | Attending: Radiation Oncology | Admitting: Radiation Oncology

## 2015-10-29 DIAGNOSIS — Z170 Estrogen receptor positive status [ER+]: Secondary | ICD-10-CM | POA: Diagnosis not present

## 2015-10-29 DIAGNOSIS — Z79899 Other long term (current) drug therapy: Secondary | ICD-10-CM | POA: Diagnosis not present

## 2015-10-29 DIAGNOSIS — Z510 Encounter for antineoplastic radiation therapy: Secondary | ICD-10-CM | POA: Diagnosis not present

## 2015-10-29 DIAGNOSIS — C50412 Malignant neoplasm of upper-outer quadrant of left female breast: Secondary | ICD-10-CM | POA: Diagnosis not present

## 2015-10-29 DIAGNOSIS — Z79810 Long term (current) use of selective estrogen receptor modulators (SERMs): Secondary | ICD-10-CM | POA: Diagnosis not present

## 2015-10-30 ENCOUNTER — Ambulatory Visit
Admission: RE | Admit: 2015-10-30 | Discharge: 2015-10-30 | Disposition: A | Discharge status: Home / Self Care | Payer: 59 | Source: Ambulatory Visit | Attending: Radiation Oncology | Admitting: Radiation Oncology

## 2015-10-30 ENCOUNTER — Encounter: Payer: Self-pay | Admitting: Radiation Oncology

## 2015-10-30 VITALS — BP 121/44 | HR 74 | Resp 16 | Wt 109.8 lb

## 2015-10-30 DIAGNOSIS — Z510 Encounter for antineoplastic radiation therapy: Secondary | ICD-10-CM | POA: Diagnosis not present

## 2015-10-30 DIAGNOSIS — Z79899 Other long term (current) drug therapy: Secondary | ICD-10-CM | POA: Diagnosis not present

## 2015-10-30 DIAGNOSIS — Z170 Estrogen receptor positive status [ER+]: Principal | ICD-10-CM

## 2015-10-30 DIAGNOSIS — C50412 Malignant neoplasm of upper-outer quadrant of left female breast: Secondary | ICD-10-CM | POA: Diagnosis not present

## 2015-10-30 DIAGNOSIS — Z79810 Long term (current) use of selective estrogen receptor modulators (SERMs): Secondary | ICD-10-CM | POA: Diagnosis not present

## 2015-10-30 NOTE — Progress Notes (Signed)
Weight and vitals stable. Denies pain. No evidence of lymphedema noted. Reports mild hyperpigmentation without desquamation of left/treated breast. Reports using sonafine as directed. Reports fatigue.  BP (!) 121/44 (BP Location: Left Arm, Patient Position: Sitting, Cuff Size: Normal)   Pulse 74   Resp 16   Wt 109 lb 12.8 oz (49.8 kg)   SpO2 100%   BMI 19.45 kg/m  Wt Readings from Last 3 Encounters:  10/30/15 109 lb 12.8 oz (49.8 kg)  10/16/15 110 lb 3.2 oz (50 kg)  10/09/15 112 lb 3.2 oz (50.9 kg)

## 2015-10-30 NOTE — Progress Notes (Signed)
Department of Radiation Oncology  Phone:  [phone] Fax:        [phone]  Weekly Treatment Note    Name: [name] Date: 10/30/2015 MRN: [id number] DOB: September 01, 1970   Diagnosis:     ICD-9-CM ICD-10-CM   1. Malignant neoplasm of upper-outer quadrant of left breast in female, estrogen receptor positive ([place]) 174.4 C50.412    V86.0 Z17.0      Current dose: 60 Gy  Current fraction:30   MEDICATIONS: Current Outpatient Prescriptions  Medication Sig Dispense Refill  . Calcium Carb-Cholecalciferol (CALCIUM 1000 + D) 1000-800 MG-UNIT TABS Take by mouth.    . clonazePAM (KLONOPIN) 0.5 MG tablet Take 1 tablet (0.5 mg total) by mouth 2 (two) times daily as needed for anxiety (and sleep). 60 tablet 3  . desvenlafaxine (PRISTIQ) 50 MG 24 hr tablet Take 1 tablet (50 mg total) by mouth daily. 30 tablet 3  . dexlansoprazole (DEXILANT) 60 MG capsule Take 1 capsule (60 mg total) by mouth daily. 30 capsule 3  . non-metallic deodorant (ALRA) MISC Apply 1 application topically daily as needed.    . ondansetron (ZOFRAN) 4 MG tablet Take 1-2 tablets (4-8 mg total) by mouth every 8 (eight) hours as needed for nausea or vomiting. 20 tablet 1  . oxyCODONE (OXY IR/ROXICODONE) 5 MG immediate release tablet Take 1 tablet (5 mg total) by mouth every 6 (six) hours as needed for moderate pain, severe pain or breakthrough pain. 15 tablet 0  . SYNTHROID 88 MCG tablet Take 1 tablet (88 mcg total) by mouth daily before breakfast. 30 tablet 11  . tamoxifen (NOLVADEX) 20 MG tablet Take 1 tablet (20 mg total) by mouth daily. 90 tablet 3  . zonisamide (ZONEGRAN) 50 MG capsule Take 4 capsules (200 mg total) by mouth daily. 60 capsule 1  . Wound Cleansers (RADIAPLEX EX) Apply topically.     Current Facility-Administered Medications  Medication Dose Route Frequency Provider Last Rate Last Dose  . hyaluronate sodium (RADIAPLEXRX) gel   Topical Once Tyler Pita, MD       Facility-Administered  Medications Ordered in Other Encounters  Medication Dose Route Frequency Provider Last Rate Last Dose  . ferric gluconate (NULECIT) 125 mg in sodium chloride 0.9 % 100 mL IVPB  125 mg Intravenous Once Thomas S Kefalas, PA-C      . ondansetron [facility]) tablet 8 mg  8 mg Oral Once Baird Cancer, PA-C         ALLERGIES: Review of patient's allergies indicates no known allergies.   LABORATORY DATA:  Lab Results  Component Value Date   WBC 3.6 (L) 04/09/2014   HGB 13.7 04/09/2014   HCT 42.4 04/09/2014   MCV 96.4 04/09/2014   PLT 183 04/09/2014   Lab Results  Component Value Date   NA 138 04/09/2014   K 4.1 04/09/2014   CL 104 04/09/2014   CO2 27 04/09/2014   Lab Results  Component Value Date   ALT 11 04/09/2014   AST 18 04/09/2014   ALKPHOS 42 04/09/2014   BILITOT 0.5 04/09/2014     NARRATIVE: [name] was seen today for weekly treatment management. The chart was checked and the patient's films were reviewed.  Weight and vitals stable. Denies pain. No evidence of lymphedema noted. Reports mild hyperpigmentation without desquamation of left/treated breast. Reports using sonafine as directed. Reports fatigue.  BP (!) 121/44 (BP Location: Left Arm, Patient Position: Sitting, Cuff Size: Normal)   Pulse 74   Resp 16  Wt 109 lb 12.8 oz (49.8 kg)   SpO2 100%   BMI 19.45 kg/m  Wt Readings from Last 3 Encounters:  10/30/15 109 lb 12.8 oz (49.8 kg)  10/16/15 110 lb 3.2 oz (50 kg)  10/09/15 112 lb 3.2 oz (50.9 kg)     PHYSICAL EXAMINATION: weight is 109 lb 12.8 oz (49.8 kg). Her blood pressure is 121/44 (abnormal) and her pulse is 74. Her respiration is 16 and oxygen saturation is 100%.        ASSESSMENT: The patient is doing satisfactorily with treatment.  PLAN: We will continue with the patient's radiation treatment as planned. The patient will return to clinic in 1 month for routine follow-up. She is done very well although she has had some fatigue which has  been prominent the last couple of weeks. We expect that this will subside over the next several weeks.

## 2015-11-02 ENCOUNTER — Ambulatory Visit: Payer: 59

## 2015-11-03 ENCOUNTER — Ambulatory Visit: Payer: 59

## 2015-11-03 MED FILL — ZONISAMIDE 100 MG CAPSULE: 100 | 30 days supply | Qty: 60 | Fill #2

## 2015-11-04 ENCOUNTER — Ambulatory Visit: Payer: 59

## 2015-11-08 NOTE — Progress Notes (Signed)
  Radiation Oncology         [phone] ________________________________  Name: [name] MRN: [id number]  Date: 10/30/2015  DOB: 1970-05-05  End of Treatment Note  Diagnosis:   45 y.o. 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  The left breast was treated to 50 Gy in 25 fractions 2.  The lumpectomy site was boosted to 60 Gy in 5 fractions  Beams/energy:      1.  The left breast was treated using 6 MV tangents and breathhold technique 2.  The lumpectomy site was boosted using 9 and 12 MeV electrons  Narrative: The patient tolerated radiation treatment relatively well.   She had mild erythema without desquamation.  Plan: The patient has completed radiation treatment. The patient will return to radiation oncology clinic for routine followup in one month. I advised her to call or return sooner if she has any questions or concerns related to her recovery or treatment. ________________________________  Sheral Apley. Tammi Klippel, M.D.

## 2015-11-16 MED FILL — BOTOX 100 UNITS VIAL: 100 | 84 days supply | Qty: 2 | Fill #1

## 2015-11-20 ENCOUNTER — Encounter (HOSPITAL_COMMUNITY): Payer: Self-pay

## 2015-11-25 DIAGNOSIS — G43719 Chronic migraine without aura, intractable, without status migrainosus: Secondary | ICD-10-CM | POA: Diagnosis not present

## 2015-11-25 DIAGNOSIS — G43111 Migraine with aura, intractable, with status migrainosus: Secondary | ICD-10-CM | POA: Diagnosis not present

## 2015-11-25 DIAGNOSIS — G43839 Menstrual migraine, intractable, without status migrainosus: Secondary | ICD-10-CM | POA: Diagnosis not present

## 2015-11-25 MED FILL — CYCLOBENZAPRINE 5 MG TABLET: 5 | 28 days supply | Qty: 16 | Fill #0

## 2015-11-25 MED FILL — ZONISAMIDE 100 MG CAPSULE: 100 | 30 days supply | Qty: 60 | Fill #0

## 2015-11-30 MED FILL — DEXILANT DR 60 MG CAPSULE: 60 | 30 days supply | Qty: 30 | Fill #1

## 2015-11-30 MED FILL — SYNTHROID 88 MCG TABLET: 88 | 30 days supply | Qty: 30 | Fill #1

## 2015-12-10 ENCOUNTER — Ambulatory Visit: Payer: Self-pay | Admitting: Radiation Oncology

## 2015-12-11 ENCOUNTER — Telehealth: Payer: Self-pay

## 2015-12-11 NOTE — Telephone Encounter (Signed)
Spoke with Josiah Lobo, [place] Specialist, Breast oncology program, requesting for pt genetics report for pt upcoming appt with Dr. Erline Levine on 12/20th. Fax receive for request. Sent and confirmed fax with [facility].

## 2015-12-14 ENCOUNTER — Encounter (HOSPITAL_COMMUNITY): Payer: Self-pay | Admitting: *Deleted

## 2015-12-14 ENCOUNTER — Encounter (HOSPITAL_COMMUNITY)
Admission: RE | Disposition: A | Discharge status: Home Health | Payer: Self-pay | Source: Ambulatory Visit | Attending: Internal Medicine

## 2015-12-14 ENCOUNTER — Ambulatory Visit (HOSPITAL_COMMUNITY)
Admission: RE | Admit: 2015-12-14 | Discharge: 2015-12-14 | Disposition: A | Discharge status: Home Health | Payer: 59 | Source: Ambulatory Visit | Attending: Internal Medicine | Admitting: Internal Medicine

## 2015-12-14 DIAGNOSIS — Z800 Family history of malignant neoplasm of digestive organs: Secondary | ICD-10-CM | POA: Diagnosis not present

## 2015-12-14 DIAGNOSIS — R1310 Dysphagia, unspecified: Secondary | ICD-10-CM | POA: Insufficient documentation

## 2015-12-14 DIAGNOSIS — Z853 Personal history of malignant neoplasm of breast: Secondary | ICD-10-CM | POA: Insufficient documentation

## 2015-12-14 DIAGNOSIS — B49 Unspecified mycosis: Secondary | ICD-10-CM | POA: Diagnosis not present

## 2015-12-14 DIAGNOSIS — K317 Polyp of stomach and duodenum: Secondary | ICD-10-CM

## 2015-12-14 DIAGNOSIS — K219 Gastro-esophageal reflux disease without esophagitis: Secondary | ICD-10-CM | POA: Diagnosis not present

## 2015-12-14 DIAGNOSIS — K2210 Ulcer of esophagus without bleeding: Secondary | ICD-10-CM

## 2015-12-14 HISTORY — PX: ESOPHAGOGASTRODUODENOSCOPY: SHX5428

## 2015-12-14 SURGERY — EGD (ESOPHAGOGASTRODUODENOSCOPY)
Anesthesia: Moderate Sedation

## 2015-12-14 MED ORDER — MEPERIDINE HCL 100 MG/ML IJ SOLN
INTRAMUSCULAR | Status: DC | PRN
  Administered 2015-12-14: 50 mg via INTRAVENOUS

## 2015-12-14 MED ORDER — MIDAZOLAM HCL 5 MG/5ML IJ SOLN
INTRAMUSCULAR | Status: AC
  Filled 2015-12-14: qty 10

## 2015-12-14 MED ORDER — SODIUM CHLORIDE 0.9% FLUSH
INTRAVENOUS | Status: AC
  Filled 2015-12-14: qty 10

## 2015-12-14 MED ORDER — LIDOCAINE VISCOUS 2 % MT SOLN
OROMUCOSAL | Status: DC | PRN
  Administered 2015-12-14: 1 via OROMUCOSAL

## 2015-12-14 MED ORDER — SODIUM CHLORIDE 0.9 % IV SOLN
INTRAVENOUS | Status: DC
  Administered 2015-12-14: 17:00:00 via INTRAVENOUS

## 2015-12-14 MED ORDER — ONDANSETRON HCL 4 MG/2ML IJ SOLN
INTRAMUSCULAR | Status: AC
  Filled 2015-12-14: qty 2

## 2015-12-14 MED ORDER — LIDOCAINE VISCOUS 2 % MT SOLN
OROMUCOSAL | Status: AC
  Filled 2015-12-14: qty 15

## 2015-12-14 MED ORDER — MIDAZOLAM HCL 5 MG/5ML IJ SOLN
INTRAMUSCULAR | Status: DC | PRN
Start: 2015-12-14 — End: 2015-12-14
  Administered 2015-12-14: 2 mg via INTRAVENOUS
  Administered 2015-12-14: 1 mg via INTRAVENOUS
  Administered 2015-12-14: 0.5 mg via INTRAVENOUS

## 2015-12-14 MED ORDER — STERILE WATER FOR IRRIGATION IR SOLN
Status: DC | PRN
  Administered 2015-12-14: 2.5 mL

## 2015-12-14 MED ORDER — PROMETHAZINE HCL 25 MG/ML IJ SOLN
INTRAMUSCULAR | Status: AC
  Filled 2015-12-14: qty 1

## 2015-12-14 MED ORDER — MEPERIDINE HCL 100 MG/ML IJ SOLN
INTRAMUSCULAR | Status: AC
  Filled 2015-12-14: qty 2

## 2015-12-14 MED ORDER — PROMETHAZINE HCL 25 MG/ML IJ SOLN
25.0000 mg | Freq: Once | INTRAMUSCULAR | Status: AC
  Administered 2015-12-14: 25 mg via INTRAVENOUS

## 2015-12-14 NOTE — Discharge Instructions (Signed)
EGD Discharge instructions Please read the instructions outlined below and refer to this sheet in the next few weeks. These discharge instructions provide you with general information on caring for yourself after you leave the hospital. Your doctor may also give you specific instructions. While your treatment has been planned according to the most current medical practices available, unavoidable complications occasionally occur. If you have any problems or questions after discharge, please call your doctor. ACTIVITY  You may resume your regular activity but move at a slower pace for the next 24 hours.   Take frequent rest periods for the next 24 hours.   Walking will help expel (get rid of) the air and reduce the bloated feeling in your abdomen.   No driving for 24 hours (because of the anesthesia (medicine) used during the test).   You may shower.   Do not sign any important legal documents or operate any machinery for 24 hours (because of the anesthesia used during the test).  NUTRITION  Drink plenty of fluids.   You may resume your normal diet.   Begin with a light meal and progress to your normal diet.   Avoid alcoholic beverages for 24 hours or as instructed by your caregiver.  MEDICATIONS  You may resume your normal medications unless your caregiver tells you otherwise.  WHAT YOU CAN EXPECT TODAY  You may experience abdominal discomfort such as a feeling of fullness or gas pains.  FOLLOW-UP  Your doctor will discuss the results of your test with you.  SEEK IMMEDIATE MEDICAL ATTENTION IF ANY OF THE FOLLOWING OCCUR:  Excessive nausea (feeling sick to your stomach) and/or vomiting.   Severe abdominal pain and distention (swelling).   Trouble swallowing.   Temperature over 101 F (37.8 C).   Rectal bleeding or vomiting of blood.    Esophagus was mildly inflamed and biopsied. 3 large gastric polyps were removed. 2 hemostasis clips placed to prevent post-polypectomy  bleeding.  Continue Dexilant 60 mg daily  Add Carafate suspension 1 g orally twice daily-not to be taken within 2 hours of other medications  No MRI until stomach clips are known to be gone  Swallowing precautions were reviewed with husband (stay upright for 20-30 minutes after ingesting oral medications and swallow 8 ounces of liquids to help insure medications reach the stomach prior to lying down - This applies to all medications except Carafate)  Further recommendations to follow pending review of pathology report

## 2015-12-14 NOTE — H&P (Addendum)
@LOGO @   Primary Care Physician:  Wende Neighbors, MD Primary Gastroenterologist:  Dr. Laural Golden  Pre-Procedure History & Physical: HPI:  [name] is a 45 y.o. female here for for urgent evaluation of crescendo reflux symptoms, odynophagia and dysphagia. Patient states a long history of GERD. Historically well maintained on Dexilant 60 mg until recently. Over the past couple weeks, reflux symptoms have broken through daily PPI therapy. She has a feeling as a constant lump in the back of her throat and has experience retrosternal burning on a regular basis. Vague odynophagia/ dysphagia pills and solid food. She's also had associated nausea to the point of anorexia. Rarely vomits. Has lost 8 pounds recently. Fairly recently started on tamoxifen given her recent diagnosis of breast cancer. Patient does note she often takes medications with a small amount of water before she reclines.  EGD reportedly in [place] about 6 years ago revealed reflux esophagitis.  Patient tells some nausea predating her diagnosis of breast cancer earlier this year.  Dr. Carlean Purl prescribed daily at bedtime Reglan which did not help. She only took this for a brief period of time.  Family history significant in that her mother has Barrett's esophagus, maternal uncle with esophageal cancer. Multiple other cancers (see below). Colonoscopy by Dr. Laural Golden last year without significant findings for hematochezia.        Past Medical History:  Diagnosis Date  . Anxiety   . Breast cancer of upper-outer quadrant of left female breast ([place])   . Depression   . Endometriosis   . GERD (gastroesophageal reflux disease)   . Gestational thrombocytopenia ([place])   . Hashimoto's thyroiditis   . Headache   . HSV (herpes simplex virus) infection   . Hypothyroidism   . Kidney stones   . Migraine   . Ovarian cyst     Past Surgical History:  Procedure Laterality Date  . BREAST ENHANCEMENT SURGERY    . COLONOSCOPY N/A  08/11/2014   Procedure: COLONOSCOPY;  Surgeon: Rogene Houston, MD;  Location: [facility];  Service: Endoscopy;  Laterality: N/A;  730  . DILATION AND CURETTAGE OF UTERUS    . LAPAROSCOPY    . LITHOTRIPSY    . RADIOACTIVE SEED GUIDED MASTECTOMY WITH AXILLARY SENTINEL LYMPH NODE BIOPSY Left 08/17/2015   Procedure: BRACKETED RADIOACTIVE SEED GUIDED LEFT BREAST LUMPECTOMY WITH AXILLARY SENTINEL LYMPH NODE BIOPSY;  Surgeon: Rolm Bookbinder, MD;  Location: [place];  Service: General;  Laterality: Left;  . UPPER GASTROINTESTINAL ENDOSCOPY    . WRIST SURGERY      Prior to Admission medications   Medication Sig Start Date End Date Taking? Authorizing Provider  Calcium Carb-Cholecalciferol (CALCIUM 1000 + D) 1000-800 MG-UNIT TABS Take by mouth.    Historical Provider, MD  clonazePAM (KLONOPIN) 0.5 MG tablet Take 1 tablet (0.5 mg total) by mouth 2 (two) times daily as needed for anxiety (and sleep). 10/15/15   Baird Cancer, PA-C  desvenlafaxine (PRISTIQ) 50 MG 24 hr tablet Take 1 tablet (50 mg total) by mouth daily. 10/15/15   Baird Cancer, PA-C  dexlansoprazole (DEXILANT) 60 MG capsule Take 1 capsule (60 mg total) by mouth daily. 10/15/15   Baird Cancer, PA-C  non-metallic deodorant Jethro Poling) MISC Apply 1 application topically daily as needed.    Historical Provider, MD  ondansetron (ZOFRAN) 4 MG tablet Take 1-2 tablets (4-8 mg total) by mouth every 8 (eight) hours as needed for nausea or vomiting. 10/08/15   Baird Cancer, PA-C  oxyCODONE (OXY IR/ROXICODONE) 5 MG immediate release tablet Take 1 tablet (5 mg total) by mouth every 6 (six) hours as needed for moderate pain, severe pain or breakthrough pain. 08/17/15   Rolm Bookbinder, MD  SYNTHROID 88 MCG tablet Take 1 tablet (88 mcg total) by mouth daily before breakfast. 10/15/15   Baird Cancer, PA-C  tamoxifen (NOLVADEX) 20 MG tablet Take 1 tablet (20 mg total) by mouth daily. 10/21/15   Nicholas Lose, MD  Wound Cleansers  (RADIAPLEX EX) Apply topically.    Historical Provider, MD  zonisamide (ZONEGRAN) 50 MG capsule Take 4 capsules (200 mg total) by mouth daily. 07/22/15   Nicholas Lose, MD    Allergies as of 12/14/2015  . (No Known Allergies)    Family History  Problem Relation Age of Onset  . Hypertension Mother   . Thyroid disease Mother   . Barrett's esophagus Mother   . Diabetes Father   . Thyroid disease Sister   . Thyroid cancer Sister 30    papillary  . Heart disease Maternal Grandmother   . Heart disease Maternal Grandfather   . Colon cancer Paternal Grandmother 67  . Bladder Cancer Paternal Grandfather 72  . Alcohol abuse Brother   . Esophageal cancer Maternal Uncle   . Brain cancer Paternal Uncle 16    Pineal tumor - benign  . Esophageal cancer Other 66    MGF's sister  . Breast cancer Other 37    PGMs sister    Social History   Social History  . Marital status: Married    Spouse name: N/A  . Number of children: N/A  . Years of education: N/A   Occupational History  . physician    Social History Main Topics  . Smoking status: Never Smoker  . Smokeless tobacco: Never Used  . Alcohol use No  . Drug use: No  . Sexual activity: Yes   Other Topics Concern  . Not on file   Social History Narrative  . No narrative on file    Review of Systems: See HPI, otherwise negative ROS  Physical Exam: There were no vitals taken for this visit. General:   Alert,  Well-developed, well-nourished, pleasant and cooperative in NAD Neck:  Supple; no masses or thyromegaly. No significant cervical adenopathy. Lungs:  Clear throughout to auscultation.   No wheezes, crackles, or rhonchi. No acute distress. Heart:  Regular rate and rhythm; no murmurs, clicks, rubs,  or gallops. Abdomen: Non-distended, normal bowel sounds.  Soft and nontender without appreciable mass or hepatosplenomegaly.  Pulses:  Normal pulses noted. Extremities:  Without clubbing or edema.  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  Chronic nausea and weight loss also concerning. Differential includes poorly controlled reflux, infectious etiology such as Candida as well as pill-induced injury.  Recommendations: I have offered the patient a diagnostic EGD today.  The risks, benefits, limitations, alternatives and imponderables have been reviewed with the patient. Potential for esophageal dilation, biopsy, etc. have also been reviewed.  Questions have been answered. All parties agreeable.  Further recommendations to follow.       Notice: This dictation was prepared with Dragon dictation along with smaller phrase technology. Any transcriptional errors that result from this process are unintentional and may not be corrected upon review.

## 2015-12-15 ENCOUNTER — Encounter: Payer: Self-pay | Admitting: Internal Medicine

## 2015-12-15 DIAGNOSIS — C50912 Malignant neoplasm of unspecified site of left female breast: Secondary | ICD-10-CM | POA: Diagnosis not present

## 2015-12-15 MED FILL — CARAFATE 1 GM/10 ML SUSP: 1 | 30 days supply | Qty: 600 | Fill #0

## 2015-12-15 NOTE — Op Note (Signed)
[name], [name]             ACCOUNT NO.:  [id number]  MEDICAL RECORD NO.:  [id number]  LOCATION:                                 FACILITY:  PHYSICIAN:  R. Garfield Cornea, MD FACP [facility]   DATE OF BIRTH:  1970-01-14  DATE OF PROCEDURE:  12/14/2015 DATE OF DISCHARGE:                              OPERATIVE REPORT   PROCEDURE:  EGD with esophageal biopsy with multiple gastric snare polypectomies and hemostasis clip placement.  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  Recent diagnosis of breast cancer.  Urgent EGD now being performed.  Risks, benefits, limitations, alternatives, imponderables have been discussed, questions answered.  Please see the documentation medical record.  PROCEDURE NOTE:  O2 saturation, blood pressure, pulse, and respirations monitored throughout the entire procedure.  CONSCIOUS SEDATION: 1. Versed 3.5 mg IV. 2. Demerol 50 mg IV in divided doses. 3. Phenergan 25 mg IV. 4. Zofran 4 mg IV. 5. Xylocaine gel for pharyngeal anesthesia.  INSTRUMENT:  Pentax video chip system.  FINDINGS:  Examination of the tubular esophagus revealed couple of small fixed aphthous type erosions/ulcers measuring no more than 3 mm in dimensions.  Two at the GE junction and a couple in the very proximal esophagus.  There were no pseudomembranes or evidence of reflux esophagitis. The tubular esophagus was patent throughout its course.  No nodularity. No Barrett's epithelium seen.  EGD junction was easily traversed.  Stomach:  There is minimal amount of retained gastric contents from prior meal easily washed and suctioned out.  The patient had numerous gastric polyps, the largest polyp measured approximately 1.75 cm on a stalk and had somewhat of an adenomatous appearance.  The next largest was approximately 1 cm in dimensions.  This was followed by approximately 0.75 cm  pedunculated polyp.  There were numerous 3-5 mm polyps studding the gastric mucosa.  I did not see an infiltrating process, peptic ulcer disease, or any other abnormality.  Pylorus was patent, easily traversed.  Examination of the bulb and second portion revealed no abnormalities.  THERAPEUTIC/DIAGNOSTIC MANEUVERS PERFORMED: 1. The three largest polyps in the stomach were removed with hot snare     cautery and retrieved via the [employer].  The pedicles of the two largest  polyps were prophylactically cliped with 360 clips.  The     third polyp was removed with hot snare alone.  All of these 3     polyps were retrieved. 2. Subsequently, biopsies of the subtle abnormality seen in the     esophagus, also taken for histologic study.  The patient tolerated     the procedure well.  SERVICE TIME:  36 minutes.  ESTIMATED BLOOD LOSS:  3 mL.  IMPRESSION: 1. Subtle inflammatory changes of the tubular esophagus, query viral-     induced inflammation versus- pill-induced injury. 2. Multiple gastric polyps with the largest 3 polyps removed as     described above, status post hemostasis clip placement.  RECOMMENDATIONS: 1. Continue Dexilant 60 mg daily. 2. Follow up on pathology. 3. Add Carafate suspension 1 g orally BID a day for the next 5  Days - not to be taken within 2 hours of other medication administration. 4. Further recommendations to follow pending review of pathology     Report. 5. Since patient often times takes her evening medications with a small          amount of liquids and then immediately lies down, pill-induced injury        remains in the differential.  Consequently, swallowing precautions       reviewed in detail following the procedure with spouse.  5. No future MRI until clips known to be gone.     Bridgette Habermann, MD FACP FACG   ______________________________ R. Garfield Cornea, MD Quentin Ore    RMR/MEDQ  D:  12/14/2015  T:  12/14/2015  Job:  [id number]  cc:    Hildred Laser, M.D. Fax: [phone]  Delphina Cahill, M.D. Fax: [phone]

## 2015-12-15 NOTE — Progress Notes (Signed)
EGD revealed esophagitis and multiple gastric polyps. Esophageal biopsies and gastric polypectomy performed. Hemostasis clips utilized. See  full dictated note

## 2015-12-16 ENCOUNTER — Other Ambulatory Visit: Payer: Self-pay | Admitting: Internal Medicine

## 2015-12-16 ENCOUNTER — Telehealth: Payer: Self-pay

## 2015-12-16 ENCOUNTER — Encounter: Payer: Self-pay | Admitting: Internal Medicine

## 2015-12-16 ENCOUNTER — Other Ambulatory Visit: Payer: Self-pay

## 2015-12-16 DIAGNOSIS — K259 Gastric ulcer, unspecified as acute or chronic, without hemorrhage or perforation: Secondary | ICD-10-CM

## 2015-12-16 DIAGNOSIS — T50905A Adverse effect of unspecified drugs, medicaments and biological substances, initial encounter: Secondary | ICD-10-CM

## 2015-12-16 MED ORDER — FLUCONAZOLE 100 MG PO TABS: ORAL_TABLET | ORAL | 0 refills | Status: AC

## 2015-12-16 MED FILL — FLUCONAZOLE 100 MG TABLET: 100 | 22 days supply | Qty: 23 | Fill #0

## 2015-12-16 NOTE — Progress Notes (Signed)
Discussed with Cecille Rubin in the pharmacy. Discussed with Dr. Whitney Muse. Biopsies consistent with Candida esophagitis. Hyperplastic polyps removed from stomach.  Because for the potential of QT interval prolongation with Diflucan, a repeat EKG will be performed in 7 days. Baseline tracing on file.  H. pylori serologies to be drawn. Diflucan 200 mg load with 100 mg daily 21 days. Prescription called into [facility] pharmacy.

## 2015-12-16 NOTE — Telephone Encounter (Signed)
Pt is aware of what RMR said and the Rx at the pharmacy. She is set up for an EKG on 12/18/15 @ 9:45 am.

## 2015-12-16 NOTE — Telephone Encounter (Signed)
Per Dr.Rourk- pt needs diflucan 100mg  tablets- 200mg  on day one and 100mg  daily for 21 days. No refills.  She needs an EKG in 7 days to make sure her QT interval is not prolonged and hpylori serologies at the hospital lab.  I have sent in the Rx to the [facility] pharmacy, orders for blood work and EKG have been put in.  I have mailed the letter done by RMR and the lab orders to the pt.  Ginger, pt needs EKG in 7 days, can you schedule that?

## 2015-12-18 ENCOUNTER — Ambulatory Visit (HOSPITAL_COMMUNITY)
Admission: RE | Admit: 2015-12-18 | Discharge: 2015-12-18 | Disposition: A | Discharge status: Home / Self Care | Payer: 59 | Source: Ambulatory Visit | Attending: Internal Medicine | Admitting: Internal Medicine

## 2015-12-18 ENCOUNTER — Encounter (HOSPITAL_COMMUNITY): Payer: Self-pay | Admitting: Internal Medicine

## 2015-12-21 ENCOUNTER — Telehealth: Payer: Self-pay | Admitting: Radiation Oncology

## 2015-12-21 ENCOUNTER — Ambulatory Visit: Admission: RE | Admit: 2015-12-21 | Payer: 59 | Source: Ambulatory Visit | Admitting: Radiation Oncology

## 2015-12-21 NOTE — Telephone Encounter (Signed)
Patient has not shown for 0800 appointment. Phoned patient's mobile number to inquire. Patient reports she has been up most of the night vomiting and needs to cancel her appointment. Patient will phone back once feeling better to reschedule.

## 2015-12-21 NOTE — Progress Notes (Signed)
This needs to be done

## 2015-12-22 NOTE — Telephone Encounter (Signed)
Phoned patient to explain follow up is a routine skin check and inquire if feeling better. No answer. No option to leave message since mailbox is full.

## 2015-12-23 ENCOUNTER — Other Ambulatory Visit: Payer: Self-pay | Admitting: Internal Medicine

## 2015-12-23 DIAGNOSIS — K259 Gastric ulcer, unspecified as acute or chronic, without hemorrhage or perforation: Secondary | ICD-10-CM

## 2015-12-23 NOTE — Telephone Encounter (Signed)
I tried to call the patient to remind her to have her labwork done ordered by RMR, however her voicemail was full, so I was unable to leave a message.

## 2015-12-24 NOTE — Telephone Encounter (Signed)
Pt is aware that she is to have the blood work done but she has been very busy today.

## 2015-12-24 NOTE — Telephone Encounter (Signed)
I spoke with Amy with at Dr. Donald Pore office and made her aware that we needed to speak with Dr. Imagene Gurney.  I told her to ask for Tamela Oddi in my absence.  She needs to go by the [facility] lab and complete a H Pylor test.  The order has already been placed.

## 2015-12-24 NOTE — Telephone Encounter (Signed)
Noted  

## 2015-12-24 NOTE — Telephone Encounter (Signed)
Routing to Ginger.  

## 2015-12-25 ENCOUNTER — Encounter (HOSPITAL_COMMUNITY): Payer: 59 | Attending: Internal Medicine

## 2015-12-25 ENCOUNTER — Other Ambulatory Visit (HOSPITAL_COMMUNITY): Payer: Self-pay | Admitting: Emergency Medicine

## 2015-12-25 ENCOUNTER — Other Ambulatory Visit: Payer: Self-pay | Admitting: Internal Medicine

## 2015-12-25 ENCOUNTER — Other Ambulatory Visit (HOSPITAL_COMMUNITY): Payer: Self-pay | Admitting: Oncology

## 2015-12-25 ENCOUNTER — Other Ambulatory Visit (HOSPITAL_COMMUNITY): Payer: Self-pay | Admitting: *Deleted

## 2015-12-25 DIAGNOSIS — K259 Gastric ulcer, unspecified as acute or chronic, without hemorrhage or perforation: Secondary | ICD-10-CM | POA: Diagnosis not present

## 2015-12-25 DIAGNOSIS — K219 Gastro-esophageal reflux disease without esophagitis: Secondary | ICD-10-CM

## 2015-12-25 DIAGNOSIS — G43511 Persistent migraine aura without cerebral infarction, intractable, with status migrainosus: Secondary | ICD-10-CM

## 2015-12-25 MED ORDER — ELETRIPTAN HYDROBROMIDE 40 MG PO TABS: 40.0000 mg | ORAL_TABLET | ORAL | 0 refills | Status: AC | PRN

## 2015-12-25 MED ORDER — FROVATRIPTAN SUCCINATE 2.5 MG PO TABS: 2.5000 mg | ORAL_TABLET | ORAL | 0 refills | Status: AC | PRN

## 2015-12-25 MED FILL — ELETRIPTAN HBR 40 MG TABLET: 40 | 30 days supply | Qty: 9 | Fill #0

## 2015-12-25 NOTE — Progress Notes (Signed)
[name]'s reason for visit today is for labs as scheduled per MD orders.  Venipuncture performed with a 23 gauge butterfly needle to L Antecubital.  [name] tolerated procedure well and without incident; questions were answered and patient was discharged.

## 2015-12-26 LAB — H. PYLORI ANTIBODY, IGG: H Pylori IgG: 1.3 U/mL — ABNORMAL HIGH (ref 0.0–0.8)

## 2015-12-28 NOTE — Progress Notes (Signed)
H.pylori serology positive. As she is on Diflucan and has only about 1 week left, recommend completing course of Diflucan before starting Prevpac (or Pylera if she has been on amoxicillin recently or ever treated before). Diflucan interacts with multiple medications, and this is something that can be treated as soon as Diflucan is finished. Routing to Dr. Gala Romney to review on his return Wednesday. We can let her know it's positive and will be treated as soon as Diflucan is completed.

## 2015-12-30 DIAGNOSIS — C50912 Malignant neoplasm of unspecified site of left female breast: Secondary | ICD-10-CM | POA: Diagnosis not present

## 2015-12-31 MED FILL — DEXILANT DR 60 MG CAPSULE: 60 | 30 days supply | Qty: 30 | Fill #2

## 2015-12-31 MED FILL — ZONISAMIDE 100 MG CAPSULE: 100 | 30 days supply | Qty: 60 | Fill #1

## 2016-01-01 MED FILL — NAPROXEN 500 MG TABLET: 500 | 30 days supply | Qty: 60 | Fill #0

## 2016-01-05 DIAGNOSIS — G43111 Migraine with aura, intractable, with status migrainosus: Secondary | ICD-10-CM | POA: Diagnosis not present

## 2016-01-05 DIAGNOSIS — R51 Headache: Secondary | ICD-10-CM | POA: Diagnosis not present

## 2016-01-05 DIAGNOSIS — G43719 Chronic migraine without aura, intractable, without status migrainosus: Secondary | ICD-10-CM | POA: Diagnosis not present

## 2016-01-05 DIAGNOSIS — M542 Cervicalgia: Secondary | ICD-10-CM | POA: Diagnosis not present

## 2016-01-05 DIAGNOSIS — M791 Myalgia: Secondary | ICD-10-CM | POA: Diagnosis not present

## 2016-01-05 DIAGNOSIS — G43839 Menstrual migraine, intractable, without status migrainosus: Secondary | ICD-10-CM | POA: Diagnosis not present

## 2016-01-05 MED FILL — ZONISAMIDE 50 MG CAPSULE: 50 | 30 days supply | Qty: 30 | Fill #0

## 2016-01-08 ENCOUNTER — Telehealth: Payer: Self-pay

## 2016-01-08 MED ORDER — AMOXICILL-CLARITHRO-LANSOPRAZ PO MISC: Freq: Two times a day (BID) | ORAL | 0 refills | Status: DC

## 2016-01-08 MED ORDER — AMOXICILL-CLARITHRO-LANSOPRAZ PO MISC: Freq: Two times a day (BID) | ORAL | 0 refills | Status: AC

## 2016-01-08 MED FILL — AMOXICILLIN 500 MG CAPSULE: 500 | 14 days supply | Qty: 56 | Fill #0

## 2016-01-08 MED FILL — LANSOPRAZOLE DR 30 MG CAP: 30 | 14 days supply | Qty: 28 | Fill #0

## 2016-01-08 MED FILL — CLARITHROMYCIN 500 MG TAB: 500 | 14 days supply | Qty: 28 | Fill #0

## 2016-01-08 NOTE — Telephone Encounter (Signed)
I contacted pharmacy and reviewed drug to drug interactions with current med list. Would not start Prevpac until 72 hours after last Diflucan dose to be on safe side.   Do not take Relpax during h.pylori treatment or for 72 hours after. Frova is appropriate as an alternative (migraines). Reviewed with pharmacist who did not find any significant interactions, although epocrates and epic had Relpax as a contraindication. Hold Dexilant while on this therapy and take Prevacid, which is included in the Prevpac.   I was unable to reach on phone. I left an alternative number for her to call me, and I'm sending a message.

## 2016-01-08 NOTE — Telephone Encounter (Signed)
Pt called to inform us that she is finished the Diflucan and she can start the H-pylori treatment. Please advise

## 2016-01-12 NOTE — Telephone Encounter (Signed)
I have not. We can reach out to her again.

## 2016-01-12 NOTE — Telephone Encounter (Signed)
[name], did you ever hear back from Elmore?

## 2016-01-13 NOTE — Telephone Encounter (Signed)
I spoke with Dr.Boak and she said she got [name]'s messages and she understood everything. She is doing well right now and will call if she needs anything.

## 2016-01-19 MED FILL — DESVENLAFAXINE SUC ER 50 MG: 50 | 90 days supply | Qty: 90 | Fill #1

## 2016-01-20 DIAGNOSIS — M542 Cervicalgia: Secondary | ICD-10-CM | POA: Diagnosis not present

## 2016-01-20 DIAGNOSIS — G43839 Menstrual migraine, intractable, without status migrainosus: Secondary | ICD-10-CM | POA: Diagnosis not present

## 2016-01-20 DIAGNOSIS — R51 Headache: Secondary | ICD-10-CM | POA: Diagnosis not present

## 2016-01-20 DIAGNOSIS — G43111 Migraine with aura, intractable, with status migrainosus: Secondary | ICD-10-CM | POA: Diagnosis not present

## 2016-01-20 DIAGNOSIS — M791 Myalgia: Secondary | ICD-10-CM | POA: Diagnosis not present

## 2016-01-20 DIAGNOSIS — G43719 Chronic migraine without aura, intractable, without status migrainosus: Secondary | ICD-10-CM | POA: Diagnosis not present

## 2016-02-01 MED FILL — clonazePAM 0.5 MG TABS: 0.5 | 30 days supply | Qty: 60 | Fill #1

## 2016-02-01 MED FILL — ZONISAMIDE 100 MG CAPSULE: 100 | 30 days supply | Qty: 60 | Fill #2

## 2016-02-01 MED FILL — ZONISAMIDE 50 MG CAPSULE: 50 | 30 days supply | Qty: 30 | Fill #1

## 2016-02-09 MED FILL — TAMOXIFEN 20 MG TABLET: 20 | 90 days supply | Qty: 90 | Fill #1

## 2016-02-11 MED FILL — BOTOX 100 UNITS VIAL: 100 | 84 days supply | Qty: 2 | Fill #2

## 2016-02-25 DIAGNOSIS — G43719 Chronic migraine without aura, intractable, without status migrainosus: Secondary | ICD-10-CM | POA: Diagnosis not present

## 2016-02-25 MED FILL — CYCLOBENZAPRINE 5 MG TABLET: 5 | 30 days supply | Qty: 30 | Fill #0

## 2016-02-25 MED FILL — ZONISAMIDE 50 MG CAPSULE: 50 | 30 days supply | Qty: 30 | Fill #0

## 2016-02-25 MED FILL — SYNTHROID 88 MCG TABLET: 88 | 30 days supply | Qty: 30 | Fill #2

## 2016-02-25 MED FILL — DEXILANT DR 60 MG CAPSULE: 60 | 30 days supply | Qty: 30 | Fill #3

## 2016-02-25 MED FILL — ZONISAMIDE 100 MG CAPSULE: 100 | 30 days supply | Qty: 60 | Fill #0

## 2016-02-29 MED FILL — clonazePAM 0.5 MG TABS: 0.5 | 30 days supply | Qty: 60 | Fill #2

## 2016-03-24 MED FILL — ZONISAMIDE 50 MG CAPSULE: 50 | 30 days supply | Qty: 30 | Fill #1

## 2016-03-24 MED FILL — ZONISAMIDE 100 MG CAPSULE: 100 | 30 days supply | Qty: 60 | Fill #1

## 2016-03-29 DIAGNOSIS — M85852 Other specified disorders of bone density and structure, left thigh: Secondary | ICD-10-CM | POA: Diagnosis not present

## 2016-03-29 DIAGNOSIS — M8589 Other specified disorders of bone density and structure, multiple sites: Secondary | ICD-10-CM | POA: Diagnosis not present

## 2016-03-29 DIAGNOSIS — C50919 Malignant neoplasm of unspecified site of unspecified female breast: Secondary | ICD-10-CM | POA: Diagnosis not present

## 2016-03-29 DIAGNOSIS — G43909 Migraine, unspecified, not intractable, without status migrainosus: Secondary | ICD-10-CM | POA: Diagnosis not present

## 2016-03-29 DIAGNOSIS — C50412 Malignant neoplasm of upper-outer quadrant of left female breast: Secondary | ICD-10-CM | POA: Diagnosis not present

## 2016-03-29 DIAGNOSIS — M8588 Other specified disorders of bone density and structure, other site: Secondary | ICD-10-CM | POA: Diagnosis not present

## 2016-03-29 DIAGNOSIS — Z79899 Other long term (current) drug therapy: Secondary | ICD-10-CM | POA: Diagnosis not present

## 2016-03-29 DIAGNOSIS — Z923 Personal history of irradiation: Secondary | ICD-10-CM | POA: Diagnosis not present

## 2016-03-29 DIAGNOSIS — Z79810 Long term (current) use of selective estrogen receptor modulators (SERMs): Secondary | ICD-10-CM | POA: Diagnosis not present

## 2016-03-29 DIAGNOSIS — Z170 Estrogen receptor positive status [ER+]: Secondary | ICD-10-CM | POA: Diagnosis not present

## 2016-04-05 MED FILL — DESVENLAFAXINE SUC ER 50 MG: 50 | 90 days supply | Qty: 90 | Fill #2

## 2016-04-07 DIAGNOSIS — Z681 Body mass index (BMI) 19 or less, adult: Secondary | ICD-10-CM | POA: Diagnosis not present

## 2016-04-07 DIAGNOSIS — Z01419 Encounter for gynecological examination (general) (routine) without abnormal findings: Secondary | ICD-10-CM | POA: Diagnosis not present

## 2016-04-19 MED FILL — ZONISAMIDE 100 MG CAPSULE: 100 | 30 days supply | Qty: 60 | Fill #2

## 2016-05-03 DIAGNOSIS — F5101 Primary insomnia: Secondary | ICD-10-CM | POA: Diagnosis not present

## 2016-05-03 DIAGNOSIS — E559 Vitamin D deficiency, unspecified: Secondary | ICD-10-CM | POA: Diagnosis not present

## 2016-05-03 DIAGNOSIS — M8580 Other specified disorders of bone density and structure, unspecified site: Secondary | ICD-10-CM | POA: Diagnosis not present

## 2016-05-03 DIAGNOSIS — G43709 Chronic migraine without aura, not intractable, without status migrainosus: Secondary | ICD-10-CM | POA: Diagnosis not present

## 2016-05-03 DIAGNOSIS — E039 Hypothyroidism, unspecified: Secondary | ICD-10-CM | POA: Diagnosis not present

## 2016-05-03 DIAGNOSIS — K219 Gastro-esophageal reflux disease without esophagitis: Secondary | ICD-10-CM | POA: Diagnosis not present

## 2016-05-04 MED FILL — ZONISAMIDE 50 MG CAPSULE: 50 | 30 days supply | Qty: 30 | Fill #0

## 2016-05-04 MED FILL — TAMOXIFEN 20 MG TABLET: 20 | 90 days supply | Qty: 90 | Fill #2

## 2016-05-05 DIAGNOSIS — M542 Cervicalgia: Secondary | ICD-10-CM | POA: Diagnosis not present

## 2016-05-05 DIAGNOSIS — G43839 Menstrual migraine, intractable, without status migrainosus: Secondary | ICD-10-CM | POA: Diagnosis not present

## 2016-05-05 DIAGNOSIS — G43719 Chronic migraine without aura, intractable, without status migrainosus: Secondary | ICD-10-CM | POA: Diagnosis not present

## 2016-05-05 DIAGNOSIS — M791 Myalgia: Secondary | ICD-10-CM | POA: Diagnosis not present

## 2016-05-05 DIAGNOSIS — G43111 Migraine with aura, intractable, with status migrainosus: Secondary | ICD-10-CM | POA: Diagnosis not present

## 2016-05-05 DIAGNOSIS — R51 Headache: Secondary | ICD-10-CM | POA: Diagnosis not present

## 2016-05-05 MED FILL — SYNTHROID 88 MCG TABLET: 88 | 30 days supply | Qty: 30 | Fill #3

## 2016-05-05 MED FILL — clonazePAM 0.5 MG TABS: 0.5 | 30 days supply | Qty: 60 | Fill #0

## 2016-05-05 MED FILL — DEXILANT DR 60 MG CAPSULE: 60 | 90 days supply | Qty: 90 | Fill #0

## 2016-05-06 ENCOUNTER — Other Ambulatory Visit: Payer: Self-pay | Admitting: General Surgery

## 2016-05-06 DIAGNOSIS — N644 Mastodynia: Secondary | ICD-10-CM

## 2016-05-09 ENCOUNTER — Other Ambulatory Visit: Payer: Self-pay | Admitting: General Surgery

## 2016-05-09 DIAGNOSIS — N644 Mastodynia: Secondary | ICD-10-CM

## 2016-05-10 ENCOUNTER — Other Ambulatory Visit: Payer: Self-pay | Admitting: General Surgery

## 2016-05-10 DIAGNOSIS — Z853 Personal history of malignant neoplasm of breast: Secondary | ICD-10-CM

## 2016-05-10 DIAGNOSIS — N644 Mastodynia: Secondary | ICD-10-CM

## 2016-05-16 DIAGNOSIS — Z9012 Acquired absence of left breast and nipple: Secondary | ICD-10-CM | POA: Diagnosis not present

## 2016-05-16 DIAGNOSIS — Z87442 Personal history of urinary calculi: Secondary | ICD-10-CM | POA: Diagnosis not present

## 2016-05-16 DIAGNOSIS — Z8719 Personal history of other diseases of the digestive system: Secondary | ICD-10-CM | POA: Diagnosis not present

## 2016-05-16 DIAGNOSIS — Z8639 Personal history of other endocrine, nutritional and metabolic disease: Secondary | ICD-10-CM | POA: Diagnosis not present

## 2016-05-16 DIAGNOSIS — Z853 Personal history of malignant neoplasm of breast: Secondary | ICD-10-CM | POA: Diagnosis not present

## 2016-05-16 DIAGNOSIS — Z8669 Personal history of other diseases of the nervous system and sense organs: Secondary | ICD-10-CM | POA: Diagnosis not present

## 2016-05-16 DIAGNOSIS — B373 Candidiasis of vulva and vagina: Secondary | ICD-10-CM | POA: Diagnosis not present

## 2016-05-25 MED FILL — ZONISAMIDE 100 MG CAPSULE: 100 | 30 days supply | Qty: 60 | Fill #0

## 2016-05-27 MED FILL — BOTOX 100 UNITS VIAL: 100 | 84 days supply | Qty: 2 | Fill #3

## 2016-06-01 ENCOUNTER — Ambulatory Visit
Admission: RE | Admit: 2016-06-01 | Discharge: 2016-06-01 | Disposition: A | Discharge status: Home / Self Care | Payer: 59 | Source: Ambulatory Visit | Attending: General Surgery | Admitting: General Surgery

## 2016-06-01 DIAGNOSIS — N644 Mastodynia: Secondary | ICD-10-CM

## 2016-06-01 DIAGNOSIS — Z853 Personal history of malignant neoplasm of breast: Secondary | ICD-10-CM

## 2016-06-01 DIAGNOSIS — H52203 Unspecified astigmatism, bilateral: Secondary | ICD-10-CM | POA: Diagnosis not present

## 2016-06-01 DIAGNOSIS — N6489 Other specified disorders of breast: Secondary | ICD-10-CM | POA: Diagnosis not present

## 2016-06-01 DIAGNOSIS — R922 Inconclusive mammogram: Secondary | ICD-10-CM | POA: Diagnosis not present

## 2016-06-01 HISTORY — DX: Personal history of irradiation: Z92.3

## 2016-06-03 DIAGNOSIS — L814 Other melanin hyperpigmentation: Secondary | ICD-10-CM | POA: Diagnosis not present

## 2016-06-03 DIAGNOSIS — G43719 Chronic migraine without aura, intractable, without status migrainosus: Secondary | ICD-10-CM | POA: Diagnosis not present

## 2016-06-03 MED FILL — ZONISAMIDE 50 MG CAPSULE: 50 | 30 days supply | Qty: 30 | Fill #0

## 2016-06-20 MED FILL — clonazePAM 0.5 MG TABS: 0.5 | 30 days supply | Qty: 60 | Fill #1

## 2016-06-20 MED FILL — SYNTHROID 88 MCG TABLET: 88 | 30 days supply | Qty: 30 | Fill #4

## 2016-06-20 MED FILL — ZONISAMIDE 100 MG CAPSULE: 100 | 30 days supply | Qty: 60 | Fill #0

## 2016-06-23 MED FILL — DESVENLAFAXINE SUC ER 50 MG: 50 | 90 days supply | Qty: 90 | Fill #3

## 2016-07-25 DIAGNOSIS — G43111 Migraine with aura, intractable, with status migrainosus: Secondary | ICD-10-CM | POA: Diagnosis not present

## 2016-07-25 DIAGNOSIS — G43709 Chronic migraine without aura, not intractable, without status migrainosus: Secondary | ICD-10-CM | POA: Diagnosis not present

## 2016-07-25 DIAGNOSIS — Z681 Body mass index (BMI) 19 or less, adult: Secondary | ICD-10-CM | POA: Diagnosis not present

## 2018-01-05 IMAGING — US US BREAST*L* LIMITED INC AXILLA
1 series · 9 of 9 positions shown · non-contrast
Comparison: Previous exam(s).

ADDENDUM:
Pathology results: Pathology results from the ultrasound-guided
biopsy of the mass in the left breast revealed invasive ductal
carcinoma, ductal carcinoma in situ, and lobular carcinoma in situ.
This is concordant with the imaging findings. Dr. Oscar Mavee is aware
of the results. She has already contacted Dr. Ivan for a
surgical consultation.

There is a 3 mm suspicious group of amorphous calcifications in the
upper-outer left breast in the region of the biopsied mass. If
desired, stereotactic guided biopsy of these calcifications may be
performed versus localization and removal at time of surgery.
The patient has been instructed to call the [REDACTED] with any
questions or concerns.
I spoke with Dr. Ivan regarding preprocedural planning for this
patient's lumpectomy. At the time of radioactive seed localization
for the biopsied left breast mass, Dr. Ivan would also like an
additional radioactive seed placed at the site of previously
described 3 mm group of amorphous calcifications located just
posterior to the biopsy marking clip. These are located only
approximately 1.5 cm apart however he feels that having a seed at
both locations will adequately mark both areas for lumpectomy, to
include both the cancer and calcifications at the same time.
CLINICAL DATA: 44-year-old female presenting for a palpable area of
concern in the upper-outer left breast. The patient feels that this
has been increasing in size.
EXAM:
2D DIGITAL DIAGNOSTIC BILATERAL MAMMOGRAM WITH IMPLANTS, CAD AND
ADJUNCT TOMO
The patient has retropectoral implants. Standard and implant
displaced views were performed.
LEFT BREAST ULTRASOUND

[Series 1: us breast*left* limited inc axilla · 0.06mm/px · 9 of 9 slices shown]
[im 1/9]
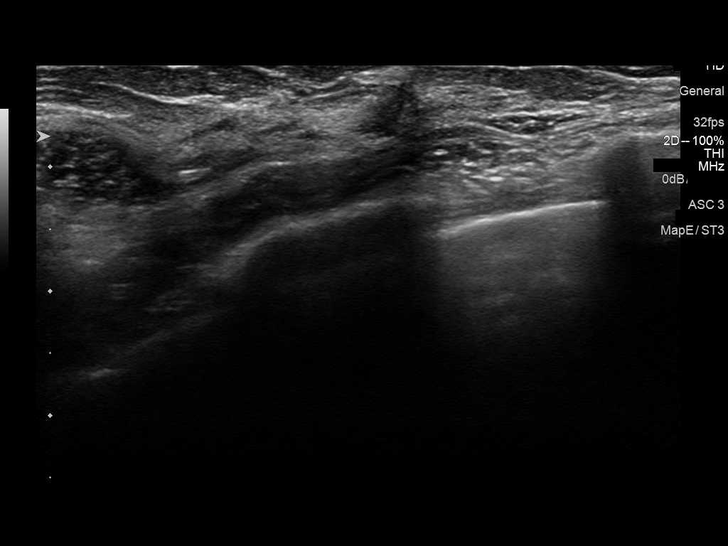
[im 2/9]
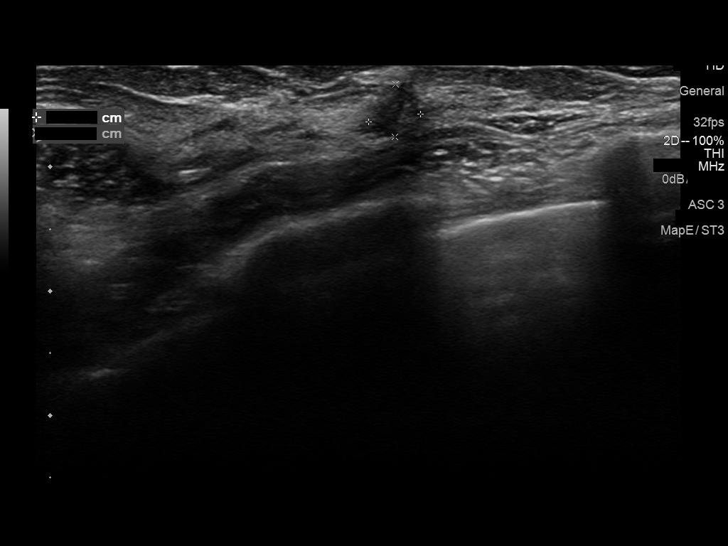
[im 3/9]
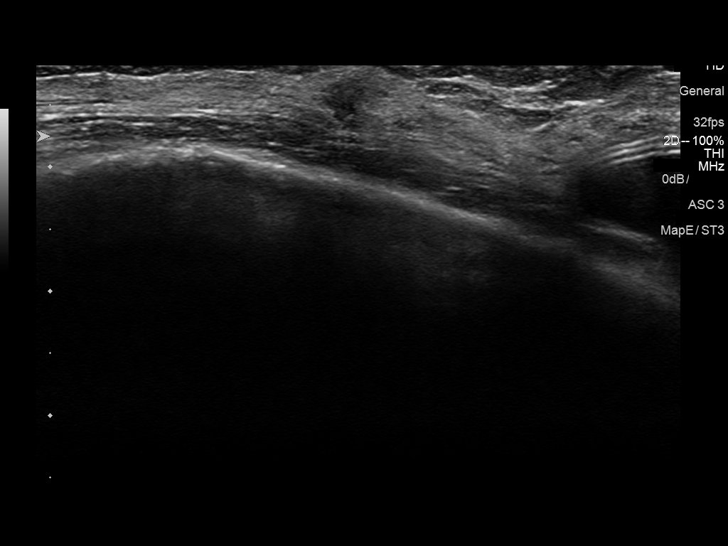
[im 4/9]
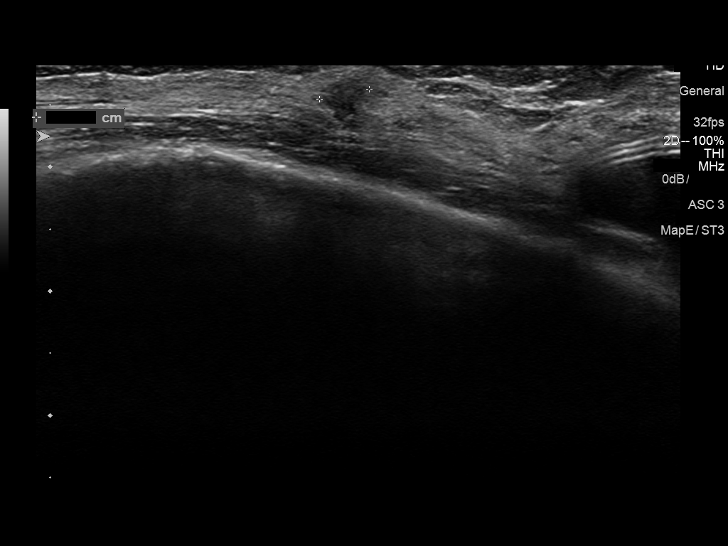
[im 5/9]
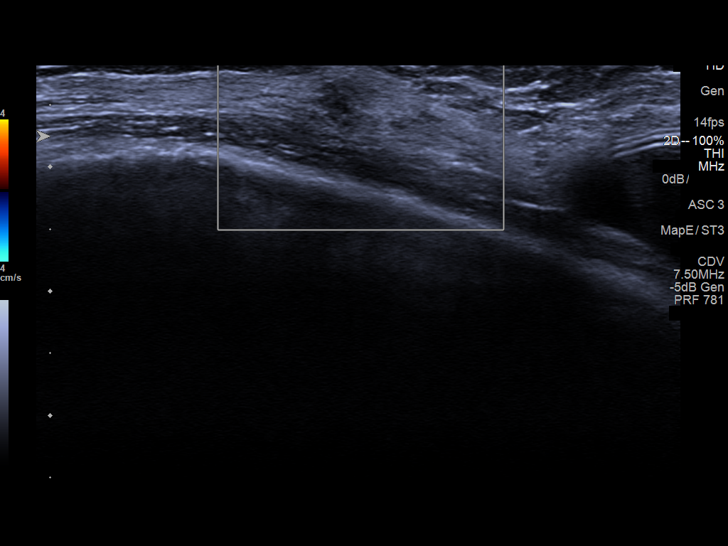
[im 6/9]
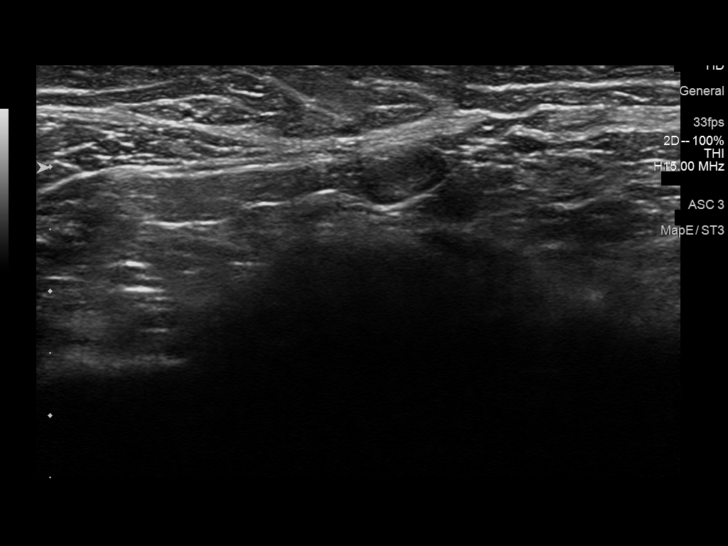
[im 7/9]
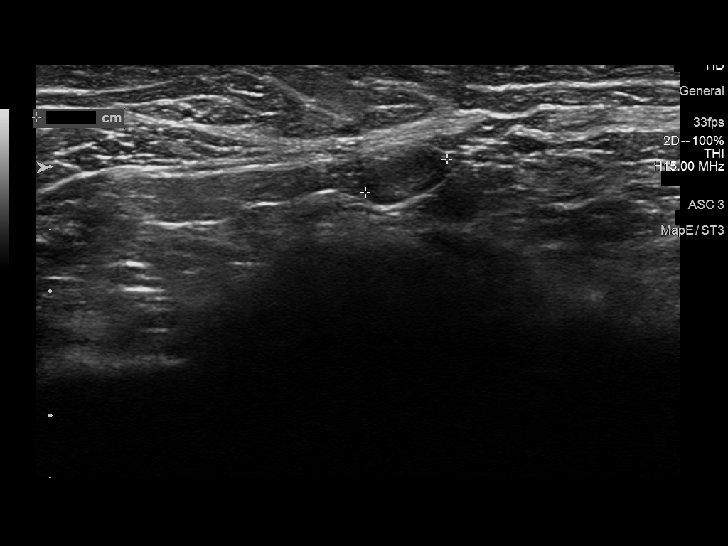
[im 8/9]
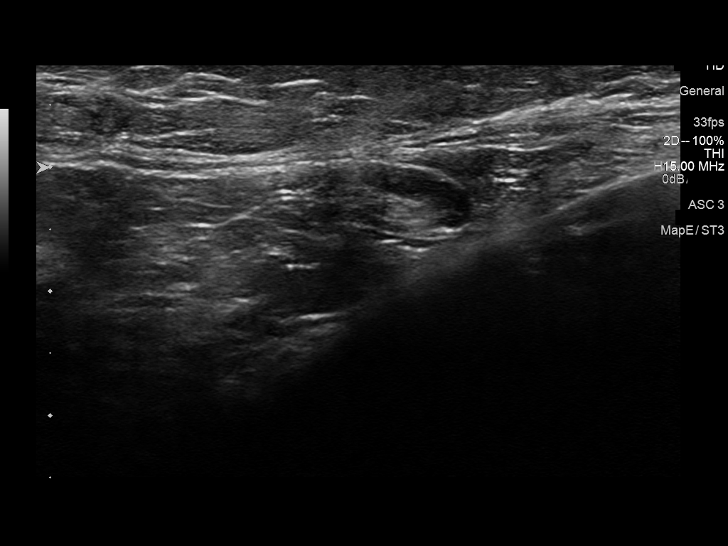
[im 9/9]
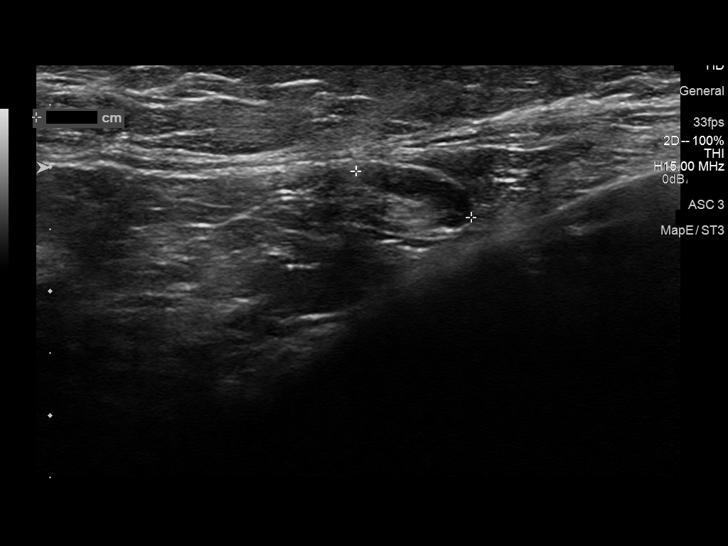

[9 of 9 positions shown; findings below may reference images not displayed]

ACR Breast Density Category d: The breast tissue is extremely dense,
which lowers the sensitivity of mammography.
FINDINGS: There is a 3 mm group of a amorphous calcifications deep to the
palpable marker in the upper outer left breast. In close proximity
to the palpable marker in the left breast is a circumscribed oval
mass likely representing a lymph node. No other suspicious
calcifications, masses or areas of distortion are seen in the
bilateral breasts.

Mammographic images were processed with CAD.

Physical exam of the palpable area of concern demonstrates a small
firm mass at approximately 2 o'clock.

Ultrasound targeted to the palpable area of concern at 2 o'clock, 5
cm from the nipple demonstrates a tiny irregular hypoechoic mass
measuring 4 x 4 x 4 mm. Normal-appearing lymph nodes are identified
in the left axilla.
IMPRESSION: 1. There is a suspicious 4 mm mass in the left breast at 5 o'clock
at the palpable site.

2. There is a 3 mm indeterminate group of calcifications in the
upper-outer left breast near the palpable site described by the
patient. It is unclear if these calcifications reside within the
mass identified sonographically.

3.  No mammographic evidence of right breast malignancy.

RECOMMENDATION:
1. Ultrasound-guided biopsy is recommended for the 4 mm left breast
mass. This has been added on to today's schedule.

2. If the biopsy marking clip does not correspond with the site of
calcifications in the left breast, a stereotactic biopsy will be
recommended.

I have discussed the findings and recommendations with the patient.
Results were also provided in writing at the conclusion of the
visit. If applicable, a reminder letter will be sent to the patient
regarding the next appointment.

BI-RADS CATEGORY  4: Suspicious.

## 2018-04-29 IMAGING — MR MR HEAD WO/W CM
8 of 13 series · 22 of 48 positions shown · IV contrast (multihance)
Comparison: None.

CLINICAL DATA: Headache and nausea for 1 week.  Breast cancer.

EXAM:
MRI HEAD WITHOUT AND WITH CONTRAST
TECHNIQUE: Multiplanar, multiecho pulse sequences of the brain and surrounding
structures were obtained without and with intravenous contrast.
CONTRAST:  10mL MULTIHANCE GADOBENATE DIMEGLUMINE 529 MG/ML IV SOLN

[Series 2: T1 · sagittal · 5.0mm · 0.43mm/px · 2 of 21 slices shown (1 of 2)]
[im 1/21]
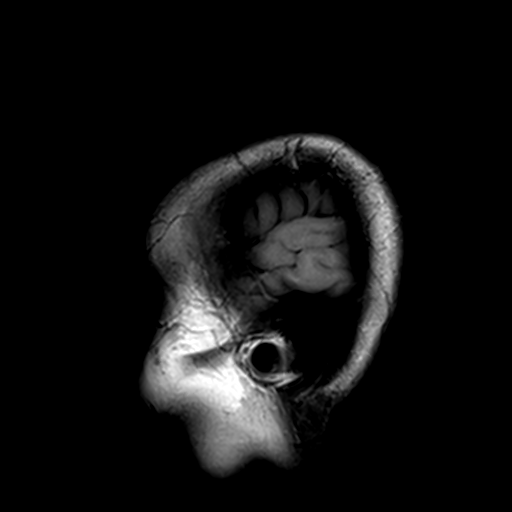
[im 21/21]
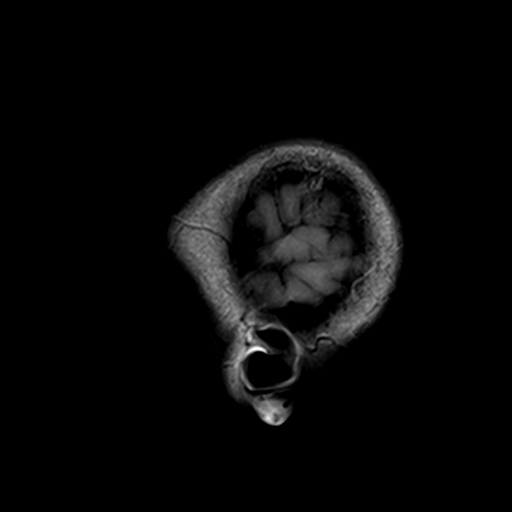

[Series 6: T2 · axial · 5.0mm · 0.60mm/px · z∈[-84,+57]mm · 2 of 23 slices shown (1 of 2)]
[im 1/23]
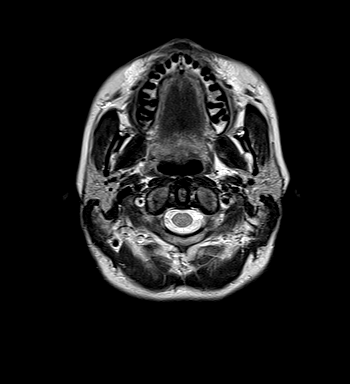
[im 23/23]
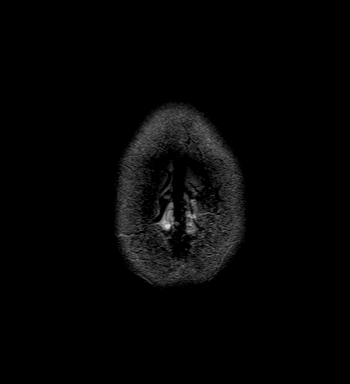

[Series 7: FLAIR · axial · 5.0mm · 0.38mm/px · z∈[-85,+57]mm · 2 of 23 slices shown]
[im 1/23]
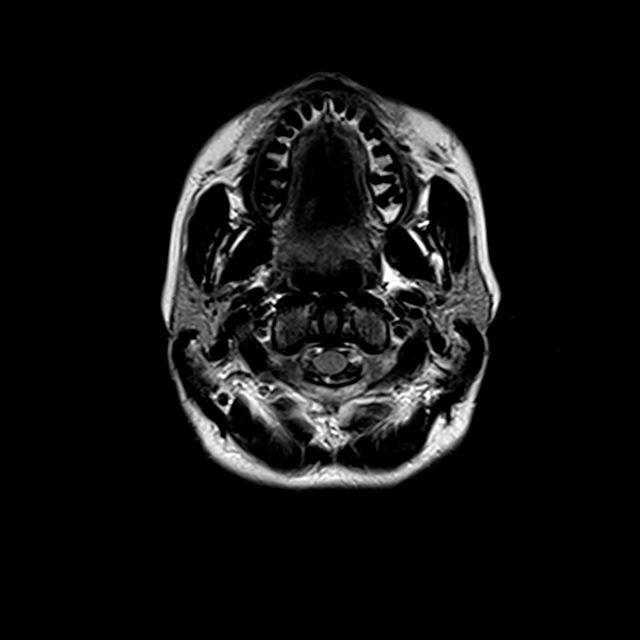
[im 23/23]
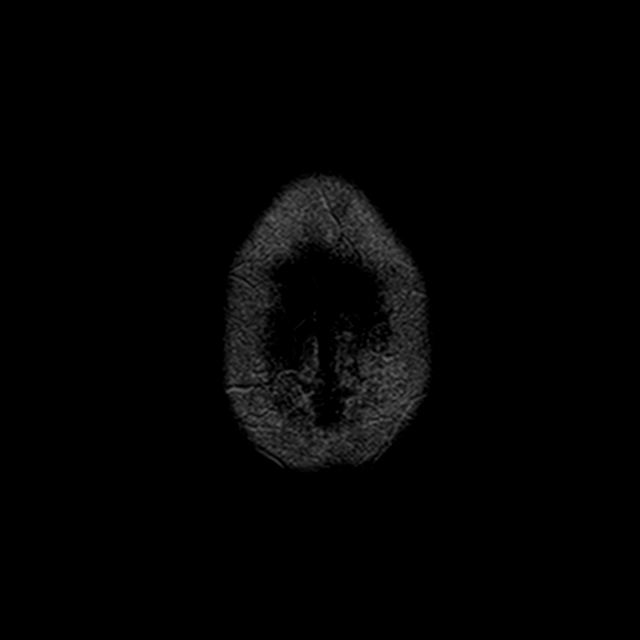

[Series 8: T1 · axial · 2.0mm · 0.45mm/px · z∈[-98,-72]mm · 2 of 95 slices shown (2 of 2)]
[im 1/95]
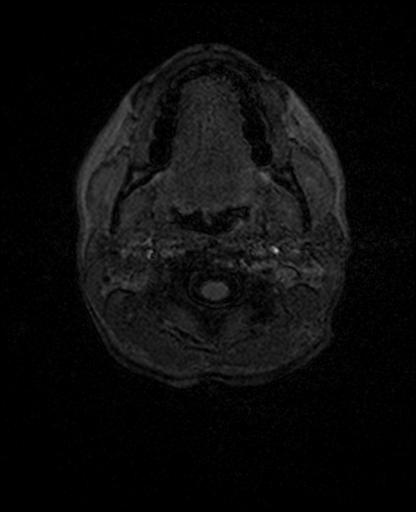
[im 14/95]
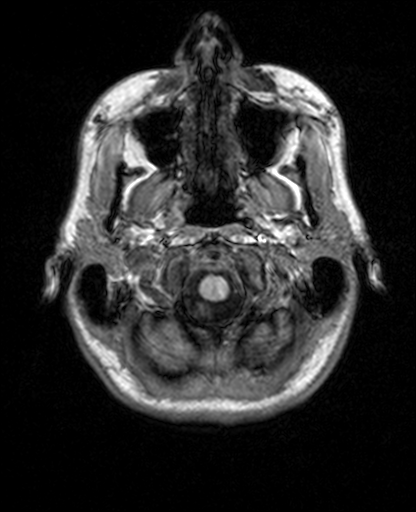

[Series 10: T2 · coronal · 5.0mm · 0.49mm/px · 2 of 26 slices shown (2 of 2)]
[im 1/26]
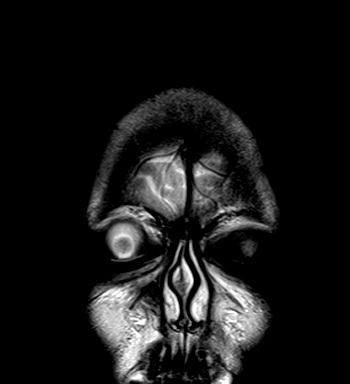
[im 26/26]
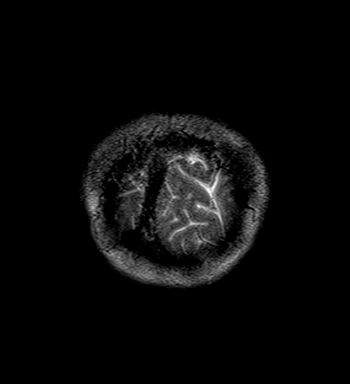

[Series 11: T1 post-contrast · axial · 2.0mm · 0.45mm/px · z∈[-98,+89]mm · 8 of 95 slices shown (1 of 3)]
[im 1/95]
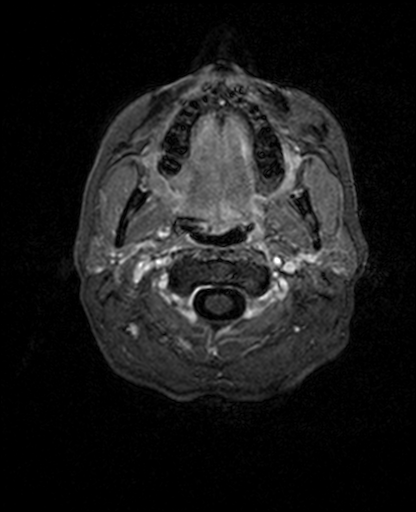
[im 14/95]
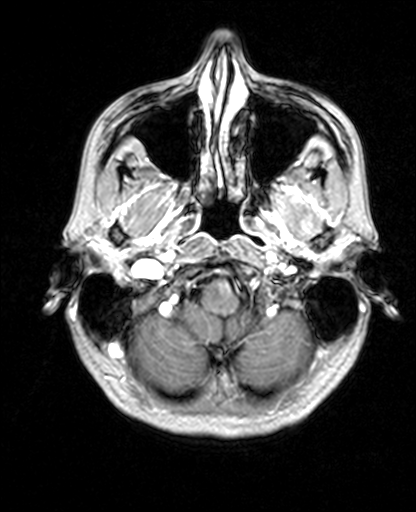
[im 27/95]
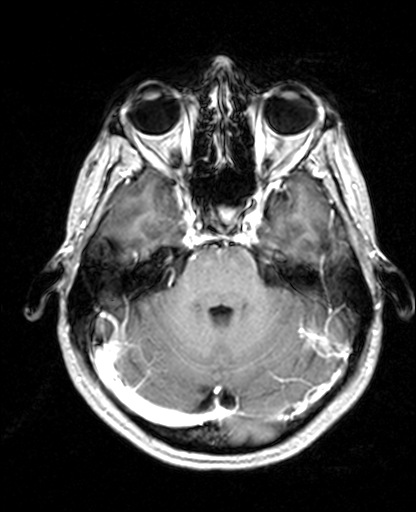
[im 41/95]
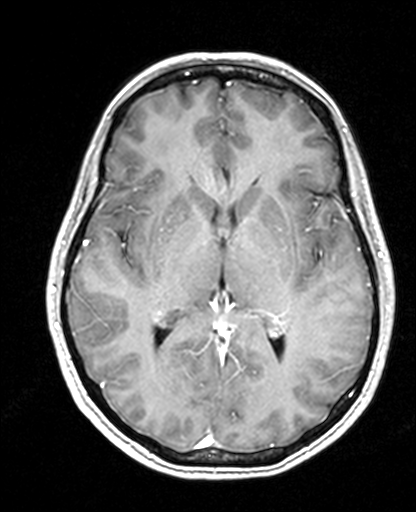
[im 54/95]
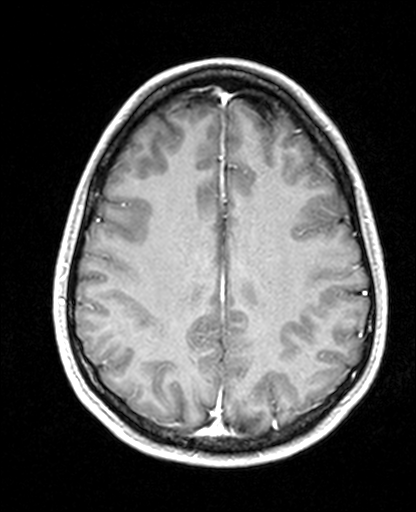
[im 68/95]
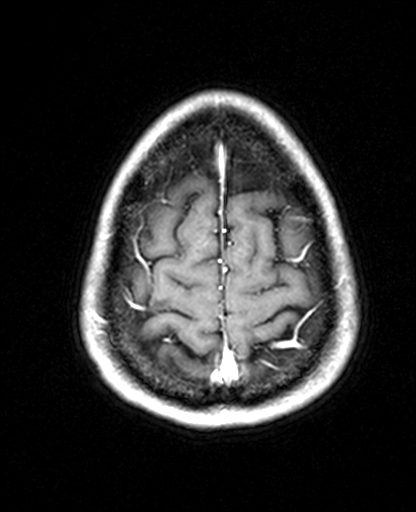
[im 81/95]
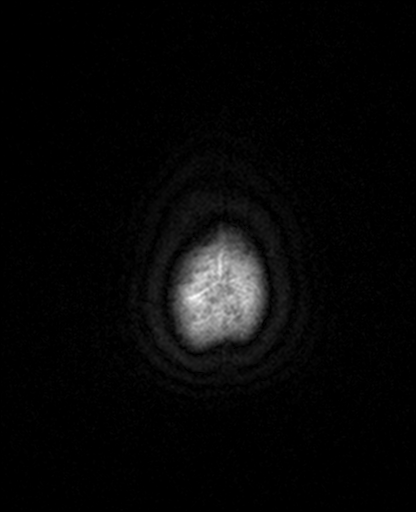
[im 95/95]
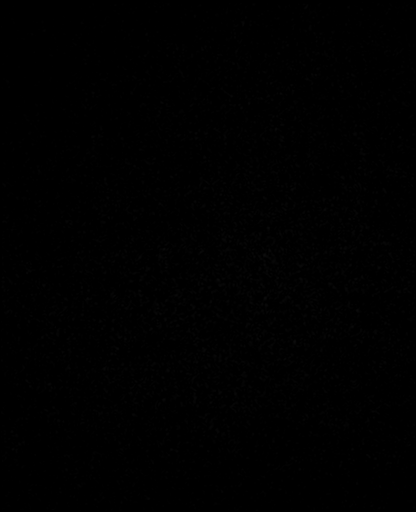

[Series 12: T1 post-contrast · coronal · 5.0mm · 0.38mm/px · 2 of 24 slices shown (2 of 3)]
[im 1/24]
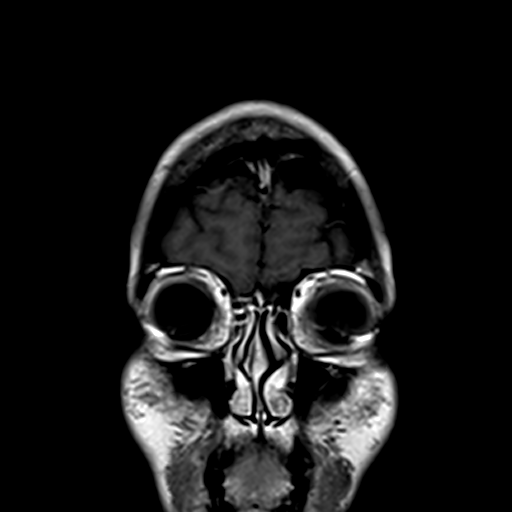
[im 24/24]
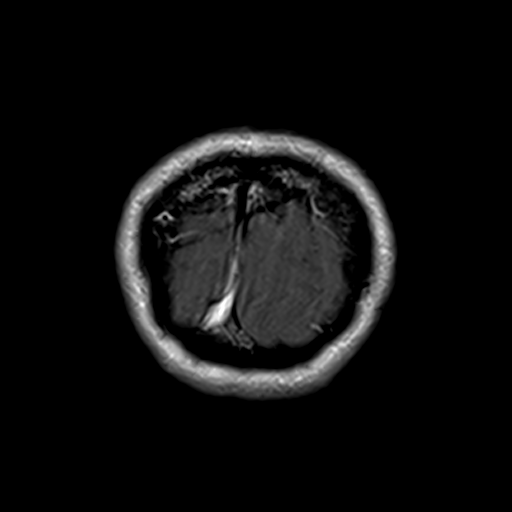

[Series 13: T1 post-contrast · sagittal · 5.0mm · 0.43mm/px · 2 of 21 slices shown (3 of 3)]
[im 1/21]
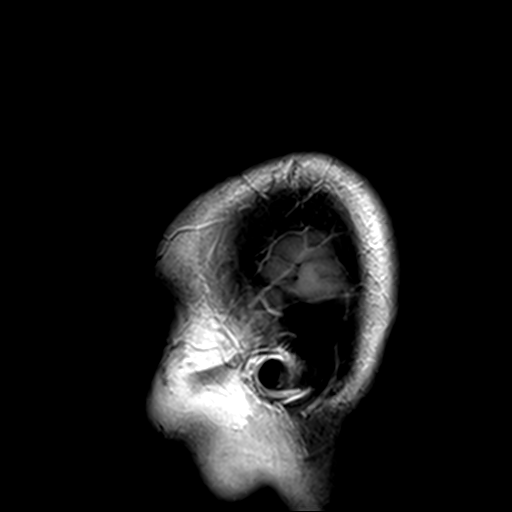
[im 21/21]
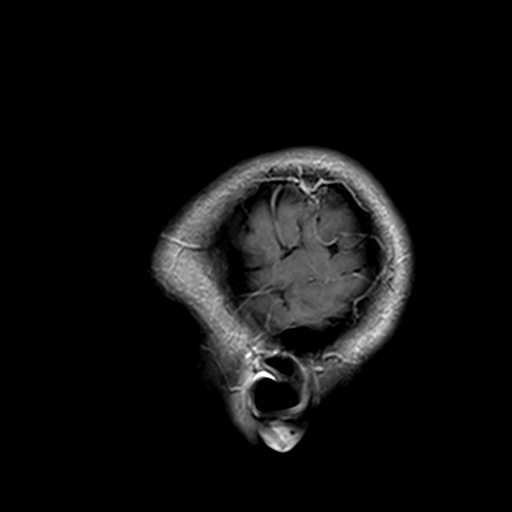

[22 of 48 positions shown; findings below may reference images not displayed]

FINDINGS: No evidence for acute infarction, hemorrhage, mass lesion,
hydrocephalus, or extra-axial fluid. Normal for age cerebral volume.
No white matter disease. No vasogenic edema.

Flow voids are maintained throughout the major intracranial vessels.
No chronic hemorrhage.

Normal pituitary and cerebellar tonsils. Slight prominence of the
transverse alar ligament at the odontoid level, non worrisome. No
upper cervical lesions.

Visualized calvarium, skull base, and upper cervical osseous
structures unremarkable. Scalp and extracranial soft tissues,
orbits, sinuses, and mastoids show no acute process.

Post infusion, no abnormal enhancement of the brain or meninges.
Major dural venous sinuses are patent. Slight linear enhancement
RIGHT posterior temporal lobe, subcortical white matter favored to
represent a small venous malformation.
IMPRESSION: Negative exam. No acute or focal intracranial abnormality. No
evidence for metastatic disease.

## 2019-02-22 ENCOUNTER — Encounter: Payer: Self-pay | Admitting: Genetic Counselor
